# Patient Record
Sex: Female | Born: 1970 | Race: White | Hispanic: No | Marital: Married | Smoking: Never smoker
Health system: Southern US, Community
[De-identification: ages and names within clinical notes are randomized; demographics above are authoritative.]

## PROBLEM LIST (undated history)

## (undated) DIAGNOSIS — E010 Iodine-deficiency related diffuse (endemic) goiter: Secondary | ICD-10-CM

## (undated) DIAGNOSIS — IMO0001 Reserved for inherently not codable concepts without codable children: Secondary | ICD-10-CM

## (undated) HISTORY — PX: TUBAL LIGATION: SHX77

## (undated) HISTORY — DX: Iodine-deficiency related diffuse (endemic) goiter: E01.0

## (undated) HISTORY — DX: Reserved for inherently not codable concepts without codable children: IMO0001

## (undated) HISTORY — PX: OTHER SURGICAL HISTORY: SHX169

---

## 2001-02-22 ENCOUNTER — Encounter: Admission: RE | Admit: 2001-02-22 | Discharge: 2001-05-23 | Payer: Self-pay | Admitting: Family Medicine

## 2003-01-12 ENCOUNTER — Emergency Department (HOSPITAL_COMMUNITY): Admission: EM | Admit: 2003-01-12 | Discharge: 2003-01-12 | Payer: Self-pay | Admitting: Emergency Medicine

## 2004-04-24 ENCOUNTER — Encounter: Admission: RE | Admit: 2004-04-24 | Discharge: 2004-04-24 | Payer: Self-pay | Admitting: Orthopedic Surgery

## 2007-06-11 ENCOUNTER — Ambulatory Visit: Payer: Self-pay | Admitting: Family Medicine

## 2007-06-11 DIAGNOSIS — K3189 Other diseases of stomach and duodenum: Secondary | ICD-10-CM | POA: Insufficient documentation

## 2007-06-11 DIAGNOSIS — R1013 Epigastric pain: Secondary | ICD-10-CM

## 2007-06-11 LAB — CONVERTED CEMR LAB
Bilirubin Urine: NEGATIVE
Blood in Urine, dipstick: NEGATIVE
Glucose, Urine, Semiquant: NEGATIVE
Ketones, urine, test strip: NEGATIVE
Nitrite: NEGATIVE
Protein, U semiquant: NEGATIVE
Specific Gravity, Urine: 1.02
Urobilinogen, UA: NEGATIVE
WBC Urine, dipstick: NEGATIVE
pH: 6

## 2007-06-15 ENCOUNTER — Encounter (INDEPENDENT_AMBULATORY_CARE_PROVIDER_SITE_OTHER): Payer: Self-pay | Admitting: *Deleted

## 2007-06-15 DIAGNOSIS — E079 Disorder of thyroid, unspecified: Secondary | ICD-10-CM | POA: Insufficient documentation

## 2007-06-15 LAB — CONVERTED CEMR LAB
ALT: 28 units/L (ref 0–35)
AST: 18 units/L (ref 0–37)
Albumin: 4.1 g/dL (ref 3.5–5.2)
Alkaline Phosphatase: 60 units/L (ref 39–117)
Basophils Absolute: 0 10*3/uL (ref 0.0–0.1)
Basophils Relative: 0 % (ref 0.0–1.0)
Bilirubin, Direct: 0.1 mg/dL (ref 0.0–0.3)
Cholesterol: 182 mg/dL (ref 0–200)
Eosinophils Absolute: 0.2 10*3/uL (ref 0.0–0.6)
Eosinophils Relative: 2 % (ref 0.0–5.0)
Glucose, Bld: 88 mg/dL (ref 70–99)
HCT: 40.9 % (ref 36.0–46.0)
HDL: 51.8 mg/dL (ref >39.0)
Hemoglobin: 14.3 g/dL (ref 12.0–15.0)
LDL Cholesterol: 118 mg/dL — ABNORMAL HIGH (ref 0–99)
Lymphocytes Relative: 29.4 % (ref 12.0–46.0)
MCHC: 34.8 g/dL (ref 30.0–36.0)
MCV: 88.9 fL (ref 78.0–100.0)
Monocytes Absolute: 0.5 10*3/uL (ref 0.2–0.7)
Monocytes Relative: 6.8 % (ref 3.0–11.0)
Neutro Abs: 4.8 10*3/uL (ref 1.4–7.7)
Neutrophils Relative %: 61.8 % (ref 43.0–77.0)
Platelets: 323 10*3/uL (ref 150–400)
RBC: 4.6 M/uL (ref 3.87–5.11)
RDW: 11.6 % (ref 11.5–14.6)
TSH: 0.26 microintl units/mL — ABNORMAL LOW (ref 0.35–5.50)
Total Bilirubin: 0.8 mg/dL (ref 0.3–1.2)
Total CHOL/HDL Ratio: 3.5
Total Protein: 7.6 g/dL (ref 6.0–8.3)
Triglycerides: 62 mg/dL (ref 0–149)
VLDL: 12 mg/dL (ref 0–40)
WBC: 7.8 10*3/uL (ref 4.5–10.5)

## 2007-07-12 ENCOUNTER — Ambulatory Visit: Payer: Self-pay | Admitting: Family Medicine

## 2007-07-14 ENCOUNTER — Encounter (INDEPENDENT_AMBULATORY_CARE_PROVIDER_SITE_OTHER): Payer: Self-pay | Admitting: *Deleted

## 2007-07-14 LAB — CONVERTED CEMR LAB
Free T4: 1 ng/dL (ref 0.6–1.6)
T3, Free: 3.3 pg/mL (ref 2.3–4.2)
TSH: 0.52 microintl units/mL (ref 0.35–5.50)

## 2008-04-25 LAB — CONVERTED CEMR LAB: Pap Smear: NORMAL

## 2008-05-17 ENCOUNTER — Ambulatory Visit: Payer: Self-pay | Admitting: Internal Medicine

## 2008-05-18 ENCOUNTER — Telehealth (INDEPENDENT_AMBULATORY_CARE_PROVIDER_SITE_OTHER): Payer: Self-pay | Admitting: *Deleted

## 2008-05-18 ENCOUNTER — Encounter (INDEPENDENT_AMBULATORY_CARE_PROVIDER_SITE_OTHER): Payer: Self-pay | Admitting: *Deleted

## 2009-01-15 ENCOUNTER — Ambulatory Visit: Payer: Self-pay | Admitting: Internal Medicine

## 2009-01-26 ENCOUNTER — Ambulatory Visit: Payer: Self-pay | Admitting: Internal Medicine

## 2009-02-01 ENCOUNTER — Encounter (INDEPENDENT_AMBULATORY_CARE_PROVIDER_SITE_OTHER): Payer: Self-pay | Admitting: *Deleted

## 2009-02-01 LAB — CONVERTED CEMR LAB
ALT: 15 units/L (ref 0–35)
AST: 17 units/L (ref 0–37)
BUN: 12 mg/dL (ref 6–23)
Basophils Absolute: 0 10*3/uL (ref 0.0–0.1)
Basophils Relative: 0.3 % (ref 0.0–3.0)
CO2: 26 meq/L (ref 19–32)
Calcium: 9.3 mg/dL (ref 8.4–10.5)
Chloride: 108 meq/L (ref 96–112)
Cholesterol: 157 mg/dL (ref 0–200)
Creatinine, Ser: 0.9 mg/dL (ref 0.4–1.2)
Eosinophils Absolute: 0.1 10*3/uL (ref 0.0–0.7)
Eosinophils Relative: 1.3 % (ref 0.0–5.0)
GFR calc non Af Amer: 73.85 mL/min (ref >60)
Glucose, Bld: 83 mg/dL (ref 70–99)
HCT: 39.4 % (ref 36.0–46.0)
HDL: 50.2 mg/dL (ref >39.00)
Hemoglobin: 13.7 g/dL (ref 12.0–15.0)
LDL Cholesterol: 98 mg/dL (ref 0–99)
Lymphocytes Relative: 28.8 % (ref 12.0–46.0)
Lymphs Abs: 1.9 10*3/uL (ref 0.7–4.0)
MCHC: 34.8 g/dL (ref 30.0–36.0)
MCV: 90.7 fL (ref 78.0–100.0)
Monocytes Absolute: 0.5 10*3/uL (ref 0.1–1.0)
Monocytes Relative: 7.3 % (ref 3.0–12.0)
Neutro Abs: 4.1 10*3/uL (ref 1.4–7.7)
Neutrophils Relative %: 62.3 % (ref 43.0–77.0)
Platelets: 269 10*3/uL (ref 150.0–400.0)
Potassium: 4 meq/L (ref 3.5–5.1)
RBC: 4.35 M/uL (ref 3.87–5.11)
RDW: 11.5 % (ref 11.5–14.6)
Sodium: 139 meq/L (ref 135–145)
TSH: 0.34 microintl units/mL — ABNORMAL LOW (ref 0.35–5.50)
Total CHOL/HDL Ratio: 3
Triglycerides: 45 mg/dL (ref 0.0–149.0)
VLDL: 9 mg/dL (ref 0.0–40.0)
WBC: 6.6 10*3/uL (ref 4.5–10.5)

## 2009-05-14 ENCOUNTER — Ambulatory Visit: Payer: Self-pay | Admitting: Internal Medicine

## 2009-05-21 ENCOUNTER — Telehealth (INDEPENDENT_AMBULATORY_CARE_PROVIDER_SITE_OTHER): Payer: Self-pay | Admitting: *Deleted

## 2009-05-30 LAB — CONVERTED CEMR LAB: TSH: 0.49 microintl units/mL (ref 0.35–5.50)

## 2009-06-28 ENCOUNTER — Ambulatory Visit: Payer: Self-pay | Admitting: Internal Medicine

## 2009-07-02 LAB — CONVERTED CEMR LAB
Free T4: 0.9 ng/dL (ref 0.6–1.6)
T3, Free: 2.8 pg/mL (ref 2.3–4.2)

## 2009-11-13 ENCOUNTER — Ambulatory Visit: Payer: Self-pay | Admitting: Internal Medicine

## 2009-11-13 DIAGNOSIS — R599 Enlarged lymph nodes, unspecified: Secondary | ICD-10-CM | POA: Insufficient documentation

## 2009-12-28 ENCOUNTER — Ambulatory Visit: Payer: Self-pay | Admitting: Internal Medicine

## 2009-12-28 DIAGNOSIS — L538 Other specified erythematous conditions: Secondary | ICD-10-CM

## 2010-01-04 ENCOUNTER — Encounter (INDEPENDENT_AMBULATORY_CARE_PROVIDER_SITE_OTHER): Payer: Self-pay | Admitting: *Deleted

## 2010-01-11 ENCOUNTER — Ambulatory Visit: Payer: Self-pay | Admitting: Internal Medicine

## 2010-01-11 DIAGNOSIS — E049 Nontoxic goiter, unspecified: Secondary | ICD-10-CM

## 2010-01-11 DIAGNOSIS — R21 Rash and other nonspecific skin eruption: Secondary | ICD-10-CM

## 2010-01-11 DIAGNOSIS — M542 Cervicalgia: Secondary | ICD-10-CM

## 2010-01-18 LAB — CONVERTED CEMR LAB
Free T4: 1.49 ng/dL (ref 0.80–1.80)
T3, Free: 2.9 pg/mL (ref 2.3–4.2)
TSH: 0.364 microintl units/mL (ref 0.350–4.500)

## 2010-06-25 NOTE — Assessment & Plan Note (Signed)
Summary: sore throat/neck hurts//kn   Vital Signs:  Patient profile:   40 year old female Weight:      201.38 pounds Temp:     97.9 degrees F oral Pulse rate:   73 / minute Pulse rhythm:   regular BP sitting:   122 / 82  (left arm) Cuff size:   large  Vitals Entered By: Army Fossa CMA (January 11, 2010 2:12 PM) CC: Sore throat, neck hurts Comments Wants spot rechecked on the back of leg. Her neck has been sore for a few weeks  throat is now sore. Pt feels that they are all related??    History of Present Illness: complained of posterior neck pain for few days, is more painful when she moves her neck  than when she self palpates it  Lymphadenopathies on the right neck ? Also would like a Lyme disease test. Wonders about the rash in her legs that we see her for few days ago and the fact that now she has neck pain and a question of lymphadenopathy.  ROS Denies fevers No sore throat All along the leg there is no rash Denies exposures to ticks  Current Medications (verified): 1)  None  Allergies: 1)  ! Penicillin  Past History:  Past Medical History: Reviewed history from 01/15/2009 and no changes required. G3 P3  Past Surgical History: Reviewed history from 06/11/2007 and no changes required. Assure procedure: Contraception  Social History: Reviewed history from 01/15/2009 and no changes required. 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   Neck:  full range of motion, slightly tender at the posterior aspect of the neck. No lymphadenopathies. the power plant is a slightly asymmetric. Minimal enlargement on the right without tenderness or nodules Lungs:  normal respiratory effort, no intercostal retractions, no accessory muscle use, and normal breath sounds.   Skin:   annular skin  lesion previously described seems to be getting better   Impression & Recommendations:  Problem # 1:  NECK PAIN (ICD-723.1) presents with neck pain and question of LAD no evidence of lymphadenopathy today Neck pain seems mechanical. Recommend rest  and observation for now  Problem # 2:  SKIN RASH (ICD-782.1) she has a skin rash in the leg, doubt this is Lyme disease but the patient really likes this checked. Will order labs, if positive, we may need to refer her to ID Orders: T-Lyme Disease ([id number]) Specimen Handling ([id number])  Problem # 3:  THYROID STIMULATING HORMONE, ABNORMAL (ICD-246.9) very mild and on and off decrease in her TSH. 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 She has not noticed any enlargement in the last few years Plan: Observation for now  Patient Instructions: 1)  Please schedule a follow-up appointment in 4 months, physical exam

## 2010-06-25 NOTE — Assessment & Plan Note (Signed)
Summary: EAR ACHE//KN   Vital Signs:  Patient profile:   40 year old female Height:      67.5 inches Weight:      197 pounds Temp:     98.6 degrees F oral Pulse rate:   72 / minute BP sitting:   110 / 78  (left arm)  Vitals Entered By: Jeremy Johann CMA (November 13, 2009 11:13 AM) CC: SHARP STABBING PAIN IN RIGHT EAR X3DAY Comments --TENDERNESS TO TOUCH REVIEWED MED LIST, PATIENT AGREED   History of Present Illness: one-week history of sharp pain at the right TMJ area. the pain is not worse with chewing She also has noticed some swelling there.  review of systems Denies fever, runny nose or sore throat No recent dental pain or dental work Note year discharge  Allergies: 1)  ! Penicillin  Past History:  Past Medical History: Reviewed history from 01/15/2009 and no changes required. G3 P3  Past Surgical History: Reviewed history from 06/11/2007 and no changes required. Assure procedure: Contraception  Social History: Reviewed history from 01/15/2009 and no changes required. 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   Head:  face symmetric No clicks at the TMJs Ears:  R ear normal and L ear normal.   She does have a pre-auricular LADs right and left. The right-sided one about 3/4 cm (larger than the left), mobile, nontender Mouth:  good dentition and no gingival abnormalities.  no evidence of an abscess to palpation Neck:  no thyromegaly.     Impression & Recommendations:  Problem # 1:  LYMPHADENOPATHY (ICD-785.6) pre-auricular lymphadenopathy No evidence of otitis media or externa Plan: Observation, if not better in a few days she will call for antibiotics. If the LAD resolves, no further eval

## 2010-06-25 NOTE — Letter (Signed)
Summary: Primary Care Consult Scheduled Letter  [facility] [place]  [address] [place], [place] [address]   Phone: [phone]  Fax: [phone]      01/04/2010 MRN: [id number]  Jenna Gardner [address] [place], [place]  [address]    Dear [name],    We have scheduled an appointment for you.  At the recommendation of Dr. Willow Ora, we have scheduled you a consult with Dr. Rolena Infante of [facility] on 01-14-2010 at 11:20am.  Their address is [address] [place] [address]. The office phone number is [phone].  If this appointment day and time is not convenient for you, please feel free to call the office of the doctor you are being referred to at the number listed above and reschedule the appointment.    It is important for you to keep your scheduled appointments. We are here to make sure you are given good patient care.   Thank you,    Renee, Patient Care Coordinator  at [facility]

## 2010-06-25 NOTE — Assessment & Plan Note (Signed)
Summary: RED KNOT ON BACK OF RIGHT LEG/KN   Vital Signs:  Patient profile:   40 year old female Height:      67.5 inches Weight:      202.25 pounds BMI:     31.32 Pulse rate:   79 / minute Pulse rhythm:   regular BP sitting:   130 / 80  (left arm) Cuff size:   large  Vitals Entered By: Army Fossa CMA (December 28, 2009 3:50 PM) CC: Has red bump on back of lower right leg Comments - x 1 month - not getting any bigger.    History of Present Illness: skin lesion in the back of the right leg for 3 or 4 weeks Has not increased in size or changed much He used a over-the-counter cream for a few days (name?) but it didn't help  ROS No fever No itching No discharge   Current Medications (verified): 1)  None  Allergies: 1)  ! Penicillin  Past History:  Past Medical History: Reviewed history from 01/15/2009 and no changes required. G3 P3  Past Surgical History: Reviewed history from 06/11/2007 and no changes required. Assure procedure: Contraception  Social History: Reviewed history from 01/15/2009 and no changes required. 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   Skin:  at the right calf she has a annular skin lesion, center is clear The lesion is slightly elevated, slightly red, no warm or tender. Seems to be involving the s.q.  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 Will refer to dermatology for confirmation  Orders: Dermatology Referral [facility])

## 2010-11-08 ENCOUNTER — Ambulatory Visit (INDEPENDENT_AMBULATORY_CARE_PROVIDER_SITE_OTHER): Payer: Managed Care, Other (non HMO) | Admitting: Internal Medicine

## 2010-11-08 ENCOUNTER — Encounter: Payer: Self-pay | Admitting: Internal Medicine

## 2010-11-08 DIAGNOSIS — J069 Acute upper respiratory infection, unspecified: Secondary | ICD-10-CM | POA: Insufficient documentation

## 2010-11-08 MED ORDER — AZITHROMYCIN 250 MG PO TABS: ORAL_TABLET | ORAL | Status: AC

## 2010-11-08 NOTE — Progress Notes (Signed)
  Subjective:    Patient ID: Jenna Gardner, female    DOB: 11-27-1970, 40 y.o.   MRN: [id number]  HPI Symptoms started a week ago, much worse for the last 2 days. Sore throat, sinus and chest congestion.  No past medical history on file.  Past Surgical History  Procedure Date  . Assure     for contraception    Review of Systems Having a subjective low-grade temp on and off No nausea, vomiting, myalgias. She does feel tired. She has seen some green nasal discharge and sputum production with cough      Objective:   Physical Exam  Constitutional: She appears well-developed and well-nourished. No distress.  HENT:  Head: Normocephalic and atraumatic.  Right Ear: External ear normal.  Left Ear: External ear normal.  Mouth/Throat: No oropharyngeal exudate.       Nontender to palpation @  the face  Cardiovascular: Normal rate, regular rhythm and normal heart sounds.   No murmur heard. Pulmonary/Chest: Effort normal and breath sounds normal. No respiratory distress. She has no rales.       Few rhonchi with cough          Assessment & Plan:

## 2010-11-08 NOTE — Patient Instructions (Addendum)
Rest, fluids , tylenol For cough, take Mucinex DM twice a day as needed  Take the antibiotic as prescribed (zpack ) only if not better in the next 3 or 4 days  Call if no better in few days Call anytime if the symptoms are severe, you have high fever, short of breath

## 2010-11-08 NOTE — Assessment & Plan Note (Signed)
See instructions

## 2010-12-05 ENCOUNTER — Encounter: Payer: Managed Care, Other (non HMO) | Admitting: Internal Medicine

## 2011-02-04 ENCOUNTER — Encounter (HOSPITAL_BASED_OUTPATIENT_CLINIC_OR_DEPARTMENT_OTHER): Payer: Self-pay | Admitting: Emergency Medicine

## 2011-02-04 ENCOUNTER — Emergency Department (HOSPITAL_BASED_OUTPATIENT_CLINIC_OR_DEPARTMENT_OTHER)
Admission: EM | Admit: 2011-02-04 | Discharge: 2011-02-04 | Disposition: A | Discharge status: Home / Self Care | Payer: No Typology Code available for payment source | Attending: Emergency Medicine | Admitting: Emergency Medicine

## 2011-02-04 ENCOUNTER — Emergency Department (INDEPENDENT_AMBULATORY_CARE_PROVIDER_SITE_OTHER): Payer: No Typology Code available for payment source

## 2011-02-04 DIAGNOSIS — M5090 Cervical disc disorder, unspecified, unspecified cervical region: Secondary | ICD-10-CM

## 2011-02-04 DIAGNOSIS — M545 Low back pain, unspecified: Secondary | ICD-10-CM | POA: Insufficient documentation

## 2011-02-04 DIAGNOSIS — M5137 Other intervertebral disc degeneration, lumbosacral region: Secondary | ICD-10-CM

## 2011-02-04 DIAGNOSIS — Y92410 Unspecified street and highway as the place of occurrence of the external cause: Secondary | ICD-10-CM | POA: Insufficient documentation

## 2011-02-04 DIAGNOSIS — M542 Cervicalgia: Secondary | ICD-10-CM | POA: Insufficient documentation

## 2011-02-04 DIAGNOSIS — T148XXA Other injury of unspecified body region, initial encounter: Secondary | ICD-10-CM

## 2011-02-04 MED ORDER — ACETAMINOPHEN 325 MG PO TABS
650.0000 mg | ORAL_TABLET | Freq: Once | ORAL | Status: AC
  Administered 2011-02-04: 650 mg via ORAL
  Filled 2011-02-04: qty 2

## 2011-02-04 MED ORDER — HYDROCODONE-ACETAMINOPHEN 5-500 MG PO TABS: 1.0000 | ORAL_TABLET | Freq: Four times a day (QID) | ORAL | Status: AC | PRN

## 2011-02-04 MED ORDER — CYCLOBENZAPRINE HCL 10 MG PO TABS: 10.0000 mg | ORAL_TABLET | Freq: Two times a day (BID) | ORAL | Status: AC | PRN

## 2011-02-04 MED ORDER — IBUPROFEN 600 MG PO TABS: 600.0000 mg | ORAL_TABLET | Freq: Four times a day (QID) | ORAL | Status: AC | PRN
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
## 2011-02-04 NOTE — ED Notes (Signed)
Pt ambulatory to restroom without difficulty.

## 2011-02-04 NOTE — ED Notes (Signed)
[place] [employer] at bs
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
## 2011-04-01 ENCOUNTER — Encounter: Payer: Self-pay | Admitting: Internal Medicine

## 2011-04-02 ENCOUNTER — Encounter: Payer: Self-pay | Admitting: Internal Medicine

## 2011-04-02 ENCOUNTER — Ambulatory Visit (INDEPENDENT_AMBULATORY_CARE_PROVIDER_SITE_OTHER): Payer: No Typology Code available for payment source | Admitting: Internal Medicine

## 2011-04-02 DIAGNOSIS — E049 Nontoxic goiter, unspecified: Secondary | ICD-10-CM

## 2011-04-02 DIAGNOSIS — Z0000 Encounter for general adult medical examination without abnormal findings: Secondary | ICD-10-CM
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
## 2011-04-02 NOTE — Assessment & Plan Note (Signed)
Pt had a slightly asymmetric thyroid on physical exam in 2010, normal exam today ~ 2002 had a "scan" done for thyromegaly  and was negative. H/o on-off TSH decrease  Plan: Observation for now

## 2011-04-02 NOTE — Assessment & Plan Note (Addendum)
Td 09 Declined flu shot Labs Diet-exercise discussed FH CAD -------> EKG today NSR, no old EKGs

## 2011-04-02 NOTE — Patient Instructions (Signed)
Came back fasting: FLP CMP TSH CBC ----dx V70

## 2011-04-03 ENCOUNTER — Other Ambulatory Visit: Payer: Self-pay | Admitting: Internal Medicine

## 2011-04-03 DIAGNOSIS — Z0000 Encounter for general adult medical examination without abnormal findings: Secondary | ICD-10-CM

## 2011-04-04 ENCOUNTER — Other Ambulatory Visit (INDEPENDENT_AMBULATORY_CARE_PROVIDER_SITE_OTHER): Payer: No Typology Code available for payment source

## 2011-04-04 DIAGNOSIS — Z0000 Encounter for general adult medical examination without abnormal findings: Secondary | ICD-10-CM

## 2011-04-04 LAB — TSH: TSH: 0.5 u[IU]/mL (ref 0.35–5.50)

## 2011-04-04 LAB — CBC WITH DIFFERENTIAL/PLATELET
Basophils Absolute: 0.1 10*3/uL (ref 0.0–0.1)
Basophils Relative: 0.7 % (ref 0.0–3.0)
Eosinophils Absolute: 0.2 10*3/uL (ref 0.0–0.7)
Eosinophils Relative: 2.1 % (ref 0.0–5.0)
HCT: 37.8 % (ref 36.0–46.0)
Hemoglobin: 12.8 g/dL (ref 12.0–15.0)
Lymphocytes Relative: 24.1 % (ref 12.0–46.0)
Lymphs Abs: 2.1 10*3/uL (ref 0.7–4.0)
MCHC: 33.8 g/dL (ref 30.0–36.0)
MCV: 91.2 fl (ref 78.0–100.0)
Monocytes Absolute: 0.5 10*3/uL (ref 0.1–1.0)
Monocytes Relative: 5.7 % (ref 3.0–12.0)
Neutro Abs: 5.9 10*3/uL (ref 1.4–7.7)
Neutrophils Relative %: 67.4 % (ref 43.0–77.0)
Platelets: 315 10*3/uL (ref 150.0–400.0)
RBC: 4.15 Mil/uL (ref 3.87–5.11)
RDW: 12.6 % (ref 11.5–14.6)
WBC: 8.8 10*3/uL (ref 4.5–10.5)

## 2011-04-04 LAB — COMPREHENSIVE METABOLIC PANEL
ALT: 15 U/L (ref 0–35)
AST: 15 U/L (ref 0–37)
Albumin: 3.6 g/dL (ref 3.5–5.2)
Alkaline Phosphatase: 60 U/L (ref 39–117)
BUN: 9 mg/dL (ref 6–23)
CO2: 27 mEq/L (ref 19–32)
Calcium: 8.9 mg/dL (ref 8.4–10.5)
Chloride: 105 mEq/L (ref 96–112)
Creatinine, Ser: 0.7 mg/dL (ref 0.4–1.2)
GFR: 99.27 mL/min (ref >60.00)
Glucose, Bld: 83 mg/dL (ref 70–99)
Potassium: 4.3 mEq/L (ref 3.5–5.1)
Sodium: 139 mEq/L (ref 135–145)
Total Bilirubin: 0.7 mg/dL (ref 0.3–1.2)
Total Protein: 6.5 g/dL (ref 6.0–8.3)

## 2011-04-04 LAB — LIPID PANEL
Cholesterol: 173 mg/dL (ref 0–200)
HDL: 61.1 mg/dL (ref >39.00)
LDL Cholesterol: 91 mg/dL (ref 0–99)
Total CHOL/HDL Ratio: 3
Triglycerides: 106 mg/dL (ref 0.0–149.0)
VLDL: 21.2 mg/dL (ref 0.0–40.0)

## 2011-04-04 NOTE — Progress Notes (Signed)
12  

## 2011-04-09 ENCOUNTER — Telehealth: Payer: Self-pay

## 2011-04-09 NOTE — Telephone Encounter (Signed)
Message copied by Beverely Low on Wed Apr 09, 2011  5:03 PM ------      Message from: Willow Ora E      Created: Tue Apr 08, 2011  1:01 PM       Advise patient:      Cholesterol is very good, thyroid is normal. All other labs normal, very good results !

## 2011-04-09 NOTE — Telephone Encounter (Signed)
Left message to advise pt labs are normal and copy mailed to pt

## 2011-06-11 DIAGNOSIS — Z0279 Encounter for issue of other medical certificate: Secondary | ICD-10-CM

## 2011-07-11 ENCOUNTER — Ambulatory Visit (INDEPENDENT_AMBULATORY_CARE_PROVIDER_SITE_OTHER): Payer: Managed Care, Other (non HMO) | Admitting: Internal Medicine

## 2011-07-11 VITALS — BP 118/82 | HR 68 | Temp 98.3°F | Wt 211.0 lb

## 2011-07-11 DIAGNOSIS — R21 Rash and other nonspecific skin eruption: Secondary | ICD-10-CM

## 2011-07-11 MED ORDER — HYDROCORTISONE 2.5 % EX CREA: TOPICAL_CREAM | Freq: Two times a day (BID) | CUTANEOUS | Status: DC

## 2011-07-11 MED ORDER — SULFAMETHOXAZOLE-TRIMETHOPRIM 800-160 MG PO TABS: 1.0000 | ORAL_TABLET | Freq: Two times a day (BID) | ORAL | Status: DC

## 2011-07-11 NOTE — Progress Notes (Signed)
  Subjective:    Patient ID: Jenna Gardner, female    DOB: Jul 28, 1970, 41 y.o.   MRN: [id number]  HPI Acute visit About a week ago she was working in her yard and scratch her left arm with a thorn. The next day, the area got red. In the last couple of days it has gotten indurated. + itching, no helped by otc creams   Past Medical History  Diagnosis Date  . No active medical problems      Review of Systems No fever chills No other skin lesions Feels well in general    Objective:   Physical Exam  Constitutional: She appears well-developed. No distress.  HENT:  Head: Normocephalic and atraumatic.  Skin: She is not diaphoretic.       L forearm fas a 12x3 mm scratch. Mild redness and induration around it, no fluctuance, d/c, golden crust       Assessment & Plan:  Early cellulitis: Cellulitis d/t a scratch w./ a thorn Plan: bactrim to cover MRSA and more uncomon pathogens , hydrocortisone to help w/itching (which may be contact dermatitis)

## 2011-07-11 NOTE — Patient Instructions (Signed)
Bactrim x 5 days, if not completely well , take x 1 week Cream x 1 week

## 2011-07-13 ENCOUNTER — Encounter: Payer: Self-pay | Admitting: Internal Medicine

## 2011-07-13 NOTE — Assessment & Plan Note (Signed)
See above

## 2011-07-15 ENCOUNTER — Encounter: Payer: Self-pay | Admitting: Internal Medicine

## 2011-07-15 ENCOUNTER — Ambulatory Visit (INDEPENDENT_AMBULATORY_CARE_PROVIDER_SITE_OTHER): Payer: Managed Care, Other (non HMO) | Admitting: Internal Medicine

## 2011-07-15 VITALS — BP 114/80 | HR 78 | Temp 98.3°F | Wt 211.0 lb

## 2011-07-15 DIAGNOSIS — R21 Rash and other nonspecific skin eruption: Secondary | ICD-10-CM

## 2011-07-15 MED ORDER — DOXYCYCLINE HYCLATE 100 MG PO TABS: 100.0000 mg | ORAL_TABLET | Freq: Two times a day (BID) | ORAL | Status: AC

## 2011-07-15 MED ORDER — PREDNISONE 10 MG PO TABS: ORAL_TABLET | ORAL | Status: DC

## 2011-07-15 NOTE — Assessment & Plan Note (Signed)
Developed what I thought was cellulitis of the left arm after she scratched her arm with a thorn working in the yard. She was started on Bactrim. Since then, what seemed to be cellulitis is about the same but has developed other areas of swelling and redness,they seem be an allergic reaction. It is unclear if the allergic reaction is to whatever she was exposed at the yard or to Bactrim (I would expect a more widespread reaction if she has an abx allergy).  I will put Bactrim in her allergy list although I am not completely sure she is allergic to it. We'll switch her to doxycycline and prednisone. If not better, will refer to dermatology or ID.

## 2011-07-15 NOTE — Patient Instructions (Signed)
Stop bactrim Start doxy and prednisone Call if no better in 2 -3 day , call anytime if worse

## 2011-07-15 NOTE — Progress Notes (Signed)
  Subjective:    Patient ID: Jenna Gardner, female    DOB: 01-Jul-1970, 41 y.o.   MRN: [id number]  HPI Acute visit She was seen last week with a rash, prescribed Bactrim and steroid cream. The next day, she developed other areas of redness and warmness around the lesion on the left arm but also on the right arm.  PMH Birth control-- Essure  Review of Systems Denies fever or chills No joint aches Admits to mild headache.     Objective:   Physical Exam  Constitutional: She appears well-developed and well-nourished.  HENT:       Lips normal  Musculoskeletal:       Arms:         Assessment & Plan:

## 2012-05-05 ENCOUNTER — Telehealth: Payer: Self-pay | Admitting: Family

## 2012-05-05 ENCOUNTER — Ambulatory Visit (INDEPENDENT_AMBULATORY_CARE_PROVIDER_SITE_OTHER): Payer: Managed Care, Other (non HMO) | Admitting: Family

## 2012-05-05 ENCOUNTER — Ambulatory Visit (HOSPITAL_BASED_OUTPATIENT_CLINIC_OR_DEPARTMENT_OTHER): Admission: RE | Admit: 2012-05-05 | Payer: Managed Care, Other (non HMO) | Source: Ambulatory Visit

## 2012-05-05 ENCOUNTER — Ambulatory Visit (HOSPITAL_BASED_OUTPATIENT_CLINIC_OR_DEPARTMENT_OTHER)
Admission: RE | Admit: 2012-05-05 | Discharge: 2012-05-05 | Disposition: A | Discharge status: Home / Self Care | Payer: Managed Care, Other (non HMO) | Source: Ambulatory Visit | Attending: Family | Admitting: Family

## 2012-05-05 VITALS — BP 130/76 | HR 88 | Wt 222.0 lb

## 2012-05-05 DIAGNOSIS — R109 Unspecified abdominal pain: Secondary | ICD-10-CM

## 2012-05-05 DIAGNOSIS — R1011 Right upper quadrant pain: Secondary | ICD-10-CM

## 2012-05-05 LAB — CBC WITH DIFFERENTIAL/PLATELET
Basophils Absolute: 0.1 10*3/uL (ref 0.0–0.1)
Basophils Relative: 1 % (ref 0–1)
Eosinophils Absolute: 0.2 10*3/uL (ref 0.0–0.7)
Eosinophils Relative: 3 % (ref 0–5)
HCT: 39.4 % (ref 36.0–46.0)
Hemoglobin: 13.3 g/dL (ref 12.0–15.0)
Lymphocytes Relative: 31 % (ref 12–46)
Lymphs Abs: 2.6 10*3/uL (ref 0.7–4.0)
MCH: 29.8 pg (ref 26.0–34.0)
MCHC: 33.8 g/dL (ref 30.0–36.0)
MCV: 88.1 fL (ref 78.0–100.0)
Monocytes Absolute: 0.7 10*3/uL (ref 0.1–1.0)
Monocytes Relative: 8 % (ref 3–12)
Neutro Abs: 4.8 10*3/uL (ref 1.7–7.7)
Neutrophils Relative %: 57 % (ref 43–77)
Platelets: 366 10*3/uL (ref 150–400)
RBC: 4.47 MIL/uL (ref 3.87–5.11)
RDW: 13.1 % (ref 11.5–15.5)
WBC: 8.4 10*3/uL (ref 4.0–10.5)

## 2012-05-05 NOTE — Patient Instructions (Addendum)
Please complete your blood work prior to leaving.  Complete your abdominal ultrasound on the first floor this evening at 5PM.

## 2012-05-05 NOTE — Progress Notes (Signed)
Subjective:    Patient ID: Jenna Gardner, female    DOB: [birth year], 41 y.o.   MRN: [id number]  HPI  Jenna Gardner is a 41 yr old female who presents today with chief compaint of abdominal pain. She reports that the abdominal pain is located on the right side up under her ribs.  Pain started last night at 6 PM and is constant.  Pain is worse with  movement or deep breath.  She reports some mild nausea which started last night as well.  She denies vomitting. She has been tolerating PO's. Denies associated fever.  Rports BM's have been normal.   Review of Systems See HPI  Past Medical History  Diagnosis Date  . No active medical problems     History   Social History  . Marital Status: Married    Spouse Name: N/A    Number of Children: 3  . 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 Smoking status: Never Smoker   . Smokeless tobacco: Never Used  . Alcohol Use: Yes     Comment: OCCASIONALLY  . Drug Use: No  . Sexually Active: Not on file   Other Topics Concern  . Not on file   Social History Narrative   Diet: slt healthier--- exercise: needs improvement --- has 3 kids (teenagers )    Past Surgical History  Procedure Date  . Assure     for contraception  . Tubal ligation   . G3 p3     ??    Family History  Problem Relation Age of Onset  . Heart disease Mother 27    M MI 31, F MI x 2 initial MI at age 54  ,also GM  . Cancer Mother     UTERINE  . Diabetes Neg Hx   . Hypertension Neg Hx   . Stroke Neg Hx   . Breast cancer Other     Father side  . Colon cancer Neg Hx     Allergies  Allergen Reactions  . Penicillins   . Bactrim Rash    Question af allergic reaction, see OV 07-15-2011     Current Outpatient Prescriptions on File Prior to Visit  Medication Sig Dispense Refill  . hydrocortisone 2.5 % cream Apply topically 2 (two) times daily.  30 g  0  . predniSONE (DELTASONE) 10 MG  tablet 3 tabs x 3 days, 2 tabs x 3 days, 1 tab x 3 days  18 tablet  0    BP 130/76  Pulse 88  Wt 222 lb (100.699 kg)  SpO2 95%       Objective:   Physical Exam  Constitutional: She is oriented to person, place, and time. She appears well-developed and well-nourished. No distress.  Cardiovascular: Normal rate and regular rhythm.   No murmur heard. Pulmonary/Chest: Effort normal and breath sounds normal. No respiratory distress. She has no wheezes. She has no rales. She exhibits no tenderness.  Abdominal: Soft. Bowel sounds are normal. She exhibits no distension and no mass. There is no rebound and no guarding.       Mild tenderness to palpation RUQ without guarding.  Neurological: She is alert and oriented to person, place, and time.  Skin: Skin is warm and dry.  Psychiatric: She has a normal mood and affect. Her behavior is normal. Judgment and thought content normal.  Assessment & Plan:

## 2012-05-05 NOTE — Assessment & Plan Note (Signed)
41 yr old female with <24 hr hx of RUQ pain.  Rule out cholecystitis.  Will obtain LFT's and abdominal US.

## 2012-05-05 NOTE — Telephone Encounter (Addendum)
pls call pt and let her know that her US shows normal gall bladder.  Lab work is also normal. I will refer her to GI.

## 2012-05-06 LAB — HEPATIC FUNCTION PANEL
ALT: 16 U/L (ref 0–35)
AST: 17 U/L (ref 0–37)
Albumin: 4.5 g/dL (ref 3.5–5.2)
Alkaline Phosphatase: 68 U/L (ref 39–117)
Bilirubin, Direct: 0.1 mg/dL (ref 0.0–0.3)
Indirect Bilirubin: 0.2 mg/dL (ref 0.0–0.9)
Total Bilirubin: 0.3 mg/dL (ref 0.3–1.2)
Total Protein: 6.8 g/dL (ref 6.0–8.3)

## 2012-05-06 LAB — LIPASE: Lipase: 24 U/L (ref 0–75)

## 2012-05-07 ENCOUNTER — Encounter: Payer: Self-pay | Admitting: Gastroenterology

## 2012-05-07 NOTE — Telephone Encounter (Signed)
Notified pt and she states [facility] left her a message to call and schedule an appointment. She will get back in touch with them for appt.

## 2012-06-02 ENCOUNTER — Encounter: Payer: Self-pay | Admitting: Gastroenterology

## 2012-06-02 ENCOUNTER — Ambulatory Visit (INDEPENDENT_AMBULATORY_CARE_PROVIDER_SITE_OTHER): Payer: Managed Care, Other (non HMO) | Admitting: Gastroenterology

## 2012-06-02 VITALS — BP 110/66 | HR 84 | Ht 67.0 in | Wt 221.8 lb

## 2012-06-02 DIAGNOSIS — R1011 Right upper quadrant pain: Secondary | ICD-10-CM

## 2012-06-02 DIAGNOSIS — K8020 Calculus of gallbladder without cholecystitis without obstruction: Secondary | ICD-10-CM

## 2012-06-02 DIAGNOSIS — K3189 Other diseases of stomach and duodenum: Secondary | ICD-10-CM

## 2012-06-02 DIAGNOSIS — R109 Unspecified abdominal pain: Secondary | ICD-10-CM

## 2012-06-02 DIAGNOSIS — R1013 Epigastric pain: Secondary | ICD-10-CM

## 2012-06-02 NOTE — Assessment & Plan Note (Signed)
Patient has nonspecific dyspepsia. Symptoms could be related2 esophageal reflux. Chronic cholecystitis must also be considered, especially in view of a history of severe right upper quadrant pain.  Recommendations #1 trial of Nexium 40 mg daily; discontinue Pepcid #2 HIDA scan #3 to consider upper endoscopy if symptoms persist despite change in medications and HIDA scan is negative

## 2012-06-02 NOTE — Patient Instructions (Addendum)
You have been scheduled for a HIDA scan at [facility] (1st floor) on . Please arrive 15 minutes prior to your scheduled appointment on 06/10/2012 at 12:30pm . Make certain not to have anything to eat or drink at least 6 hours prior to your test. Should this appointment date or time not work well for you, please call radiology scheduling at [phone].  _____________________________________________________________________ hepatobiliary (HIDA) scan is an imaging procedure used to diagnose problems in the liver, gallbladder and bile ducts. In the HIDA scan, a radioactive chemical or tracer is injected into a vein in your arm. The tracer is handled by the liver like bile. Bile is a fluid produced and excreted by your liver that helps your digestive system break down fats in the foods you eat. Bile is stored in your gallbladder and the gallbladder releases the bile when you eat a meal. A special nuclear medicine scanner (gamma camera) tracks the flow of the tracer from your liver into your gallbladder and small intestine.  During your HIDA scan  You'll be asked to change into a hospital gown before your HIDA scan begins. Your health care team will position you on a table, usually on your back. The radioactive tracer is then injected into a vein in your arm.The tracer travels through your bloodstream to your liver, where it's taken up by the bile-producing cells. The radioactive tracer travels with the bile from your liver into your gallbladder and through your bile ducts to your small intestine.You may feel some pressure while the radioactive tracer is injected into your vein. As you lie on the table, a special gamma camera is positioned over your abdomen taking pictures of the tracer as it moves through your body. The gamma camera takes pictures continually for about an hour. You'll need to keep still during the HIDA scan. This can become uncomfortable, but you may find that you can lessen the discomfort by taking deep  breaths and thinking about other things. Tell your health care team if you're uncomfortable. The radiologist will watch on a computer the progress of the radioactive tracer through your body. The HIDA scan may be stopped when the radioactive tracer is seen in the gallbladder and enters your small intestine. This typically takes about an hour. In some cases extra imaging will be performed if original images aren't satisfactory, if morphine is given to help visualize the gallbladder or if the medication CCK is given to look at the contraction of the gallbladder. This test typically takes 2 hours to complete.  Discontinue Pepcid Start Nexium Call back in one week and let Dr Arlyce Dice know how you are doing ________________________________________________________________________

## 2012-06-02 NOTE — Assessment & Plan Note (Signed)
Ultrasound and LFTs are normal. Acalculus cholecystitis remains a possibility.  Recommendations #1 HIDA scan

## 2012-06-02 NOTE — Progress Notes (Signed)
History of Present Illness: Pleasant 42 year old white female referred at the request of Dr. Drue Novel for evaluation of abdominal discomfort.  Approximately one month ago she developed severe right upper quadrant pain lasting about 2 days. It was not accompanied by nausea or vomiting. Pain subsided spontaneously. Abdominal ultrasound and LFTs were normal. She's not had a recurrence. She is complaining of persistent intermittent mild upper abdominal discomfort sometimes associated with excess gas and bloating. She may have bloating even in the absence of eating. She's taken Pepcid without relief. This has been a persistent problem for several months. She denies change of bowel habits, nausea but has occasional pyrosis.    Past Medical History  Diagnosis Date  . No active medical problems    Past Surgical History  Procedure Date  . Assure     for contraception  . Tubal ligation   . G3 p3     ??   family history includes Breast cancer in her other and paternal aunt; Diabetes in her father; Heart disease (age of onset:76) in her mother; and Uterine cancer in her mother.  There is no history of Hypertension, and Stroke, and Colon cancer, . Current Outpatient Prescriptions  Medication Sig Dispense Refill  . famotidine (PEPCID) 10 MG tablet Take 10 mg by mouth 2 (two) times daily as needed.       Allergies as of 06/02/2012 - Review Complete 06/02/2012  Allergen Reaction Noted  . Penicillins  06/11/2007  . Bactrim Rash 07/15/2011    reports that she has never smoked. She has never used smokeless tobacco. She reports that she drinks alcohol. She reports that she does not use illicit drugs.     Review of Systems: Pertinent positive and negative review of systems were noted in the above HPI section. All other review of systems were otherwise negative.  Vital signs were reviewed in today's medical record Physical Exam: General: Well developed , well nourished, no acute distress Head: Normocephalic  and atraumatic Eyes:  sclerae anicteric, EOMI Ears: Normal auditory acuity Mouth: No deformity or lesions Neck: Supple, no masses or thyromegaly Lungs: Clear throughout to auscultation Heart: Regular rate and rhythm; no murmurs, rubs or bruits Abdomen: Soft, non tender and non distended. No masses, hepatosplenomegaly or hernias noted. Normal Bowel sounds. There is no succussion splash Rectal:deferred Musculoskeletal: Symmetrical with no gross deformities  Skin: No lesions on visible extremities Pulses:  Normal pulses noted Extremities: No clubbing, cyanosis, edema or deformities noted Neurological: Alert oriented x 4, grossly nonfocal Cervical Nodes:  No significant cervical adenopathy Inguinal Nodes: No significant inguinal adenopathy Psychological:  Alert and cooperative. Normal mood and affect

## 2012-06-09 ENCOUNTER — Ambulatory Visit (INDEPENDENT_AMBULATORY_CARE_PROVIDER_SITE_OTHER): Payer: Managed Care, Other (non HMO) | Admitting: Internal Medicine

## 2012-06-09 ENCOUNTER — Encounter: Payer: Self-pay | Admitting: Internal Medicine

## 2012-06-09 VITALS — BP 112/72 | HR 73 | Temp 97.9°F | Ht 68.0 in | Wt 217.0 lb

## 2012-06-09 DIAGNOSIS — E049 Nontoxic goiter, unspecified: Secondary | ICD-10-CM

## 2012-06-09 DIAGNOSIS — Z0000 Encounter for general adult medical examination without abnormal findings: Secondary | ICD-10-CM

## 2012-06-09 DIAGNOSIS — F419 Anxiety disorder, unspecified: Secondary | ICD-10-CM | POA: Insufficient documentation

## 2012-06-09 DIAGNOSIS — R1011 Right upper quadrant pain: Secondary | ICD-10-CM

## 2012-06-09 LAB — LDL CHOLESTEROL, DIRECT: Direct LDL: 122.4 mg/dL

## 2012-06-09 LAB — BASIC METABOLIC PANEL
CO2: 27 mEq/L (ref 19–32)
Calcium: 9.6 mg/dL (ref 8.4–10.5)
Glucose, Bld: 93 mg/dL (ref 70–99)
Sodium: 137 mEq/L (ref 135–145)

## 2012-06-09 LAB — TSH: TSH: 0.65 u[IU]/mL (ref 0.35–5.50)

## 2012-06-09 LAB — LIPID PANEL: HDL: 59.2 mg/dL (ref >39.00)

## 2012-06-09 MED ORDER — ALPRAZOLAM 0.25 MG PO TABS: 0.2500 mg | ORAL_TABLET | Freq: Two times a day (BID) | ORAL | Status: DC | PRN

## 2012-06-09 NOTE — Assessment & Plan Note (Signed)
Minimal if any thyroid enlargement on physical exam, no asymmetry or nodule. Plan: Observation for now

## 2012-06-09 NOTE — Assessment & Plan Note (Signed)
Td 09 Female care-- dr Christella Hartigan [facility]) FH breast cancer per gyn, insurance has denied [employer] discussed FH CAD, asx

## 2012-06-09 NOTE — Assessment & Plan Note (Addendum)
Chart reviewed, history of dyspepsia, saw GI, ultrasound and LFTs negative. Acalculus cholecystitis? Was prescribed a HIDA scan, patient cancelled because she is now asymptomatic.  recommend to call immediately if symptoms resurface.

## 2012-06-09 NOTE — Assessment & Plan Note (Signed)
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 Plan: Xanax when necessary as long as she's not getting too sleepy and she is not driving.

## 2012-06-09 NOTE — Progress Notes (Signed)
  Subjective:    Patient ID: Jenna Gardner, female    DOB: 06/27/1970, 42 y.o.   MRN: [id number]  HPI Complete physical exam  Past Medical History  Diagnosis Date  . Contraception     essure  . Thyromegaly    Past Surgical History  Procedure Date  . Tubal ligation     essure   . G3 p3    History   Social History  . Marital Status: Married    Spouse Name: Alecia Lemming    Number of Children: 3  . Years of Education: N/A   Occupational History  . [employer]  of [place]  [employer]   Social History Main Topics  . Smoking status: Never Smoker   . Smokeless tobacco: Never Used  . Alcohol Use: Yes     Comment: OCCASIONALLY  . Drug Use: No  . Sexually Active: Not on file   Other Topics Concern  . Not on file   Social History Narrative   Diet: relatively healthy---exercise: slt better    Family History  Problem Relation Age of Onset  . Heart disease Mother 56    M MI 49, F MI x 2 initial MI at age 47  ,also GM  . Uterine cancer Mother   . Hypertension Neg Hx   . Stroke Neg Hx   . Colon cancer Neg Hx   . Breast cancer Paternal Aunt     aunt, 3 cousins; early dx (80s)  . Diabetes Father      Review of Systems Denies chest pain or shortness of breath Admits to some anxiety, mostly when she is traveling, see assessment and plan. Recently developed a right upper quadrant pain and dyspepsia, status post eval by GI, workup was negative, they recommended a HIDA scan. At this point she is asymptomatic, denies any nausea, vomiting, diarrhea or blood in the stools.See assessment and plan    Objective:   Physical Exam  General -- alert, well-developed  Neck --slightly enlarged thyroid? Thyroid is definitely not tender, not nodular. Lungs -- normal respiratory effort, no intercostal retractions, no accessory muscle use, and normal breath sounds.   Heart-- normal rate, regular rhythm, no murmur, and no gallop.   Abdomen--soft, non-tender, no distention, no  masses, no HSM, no guarding, and no rigidity.   Extremities-- no pretibial edema bilaterally Neurologic-- alert & oriented X3 and strength normal in all extremities. Psych-- Cognition and judgment appear intact. Alert and cooperative with normal attention span and concentration.  not anxious appearing and not depressed appearing.      Assessment & Plan:

## 2012-06-10 ENCOUNTER — Encounter (HOSPITAL_COMMUNITY): Payer: Managed Care, Other (non HMO)

## 2012-06-14 ENCOUNTER — Encounter: Payer: Self-pay | Admitting: *Deleted

## 2012-08-21 ENCOUNTER — Ambulatory Visit: Payer: Managed Care, Other (non HMO) | Admitting: Family Medicine

## 2012-10-19 ENCOUNTER — Encounter: Payer: Self-pay | Admitting: Internal Medicine

## 2012-10-19 ENCOUNTER — Ambulatory Visit (INDEPENDENT_AMBULATORY_CARE_PROVIDER_SITE_OTHER): Payer: Managed Care, Other (non HMO) | Admitting: Internal Medicine

## 2012-10-19 VITALS — BP 134/86 | HR 90 | Temp 98.5°F | Wt 219.0 lb

## 2012-10-19 DIAGNOSIS — R21 Rash and other nonspecific skin eruption: Secondary | ICD-10-CM

## 2012-10-19 DIAGNOSIS — W57XXXA Bitten or stung by nonvenomous insect and other nonvenomous arthropods, initial encounter: Secondary | ICD-10-CM

## 2012-10-19 MED ORDER — DOXYCYCLINE HYCLATE 100 MG PO TABS: 100.0000 mg | ORAL_TABLET | Freq: Two times a day (BID) | ORAL | Status: DC

## 2012-10-19 NOTE — Patient Instructions (Addendum)
Take the antibiotic as prescribed Hydrocortisone 1% cream, apply twice a day for a few days. Call you develop any fever, chills, joint aches or fatigue.

## 2012-10-19 NOTE — Progress Notes (Signed)
  Subjective:    Patient ID: Jenna Gardner, female    DOB: 1970-07-15, 42 y.o.   MRN: [id number]  HPI Acute visit 8 days ago   found a [place] tick at the left leg. Yesterday   developed a small rash at the area. Patient concerned about Lyme and other diseases.  Past Medical History  Diagnosis Date  . Contraception     essure  . Thyromegaly    Past Surgical History  Procedure Laterality Date  . Tubal ligation      essure   . G3 p3      Review of Systems Denies fever, chills. No other rashes. No headache or dizziness. No fatigue     Objective:   Physical Exam  Constitutional: She appears well-developed and well-nourished.  Skin:     Psychiatric: She has a normal mood and affect. Her behavior is normal. Judgment and thought content normal.       Assessment & Plan:   Tick bite S/p tick bite now without erythema. No other symptoms. Patient concerned about Lyme disease. I think  Benefits of empiric abx  > risk of missing the opportunity of the treat early disease if present, patient aware of my rationale for treatment and agreed. Plan: Antibiotics

## 2013-03-09 ENCOUNTER — Ambulatory Visit (INDEPENDENT_AMBULATORY_CARE_PROVIDER_SITE_OTHER): Payer: Managed Care, Other (non HMO) | Admitting: Internal Medicine

## 2013-03-09 ENCOUNTER — Encounter: Payer: Self-pay | Admitting: Internal Medicine

## 2013-03-09 VITALS — BP 123/83 | HR 88 | Temp 97.0°F | Wt 225.0 lb

## 2013-03-09 DIAGNOSIS — E049 Nontoxic goiter, unspecified: Secondary | ICD-10-CM

## 2013-03-09 LAB — SEDIMENTATION RATE: Sed Rate: 12 mm/hr (ref 0–22)

## 2013-03-09 NOTE — Assessment & Plan Note (Signed)
Pt had a slightly asymmetric thyroid on physical exam in 2010, normal exam in 2012. ~ 2002 had a "scan" done for thyromegaly and was negative.  On today's exam, there is smooth enlargement of the right thyroid. Also has some problems "gagging ". Plan: TFTs, sedimentation rate, thyroid ultrasound. Referred to endocrinology As far as gagging, I recommend observation, throat and neck examination are normal except for mild thyromegaly. She's a nonsmoker and currently has no GERD symptoms.  Consider  ENT eval.

## 2013-03-09 NOTE — Progress Notes (Signed)
  Subjective:    Patient ID: Jenna Gardner, female    DOB: 08-07-1970, 42 y.o.   MRN: [id number]  HPI Here to discuss her thyroid gland. Lately, has noted  her thyroid gland to be increase in size, able to feel it when she palpate  the neck, not tender. Also has noted that occasionally wake up gagging, sometimes has similar feeling when eating.  Past Medical History  Diagnosis Date  . Contraception     essure  . Thyromegaly    Past Surgical History  Procedure Laterality Date  . Tubal ligation      essure   . G3 p3     History   Social History  . Marital Status: Married    Spouse Name: Alecia Lemming    Number of Children: 3  . Years of Education: N/A   Occupational History  . [employer]  of [employer]  [employer]   Social History Main Topics  . Smoking status: Never Smoker   . Smokeless tobacco: Never Used  . Alcohol Use: Yes     Comment: OCCASIONALLY  . Drug Use: No  . Sexual Activity: Not on file   Other Topics Concern  . Not on file   Social History Narrative  . No narrative on file    Review of Systems Denies fever chills or unexplained weight loss. No heartburn, dysphagia or odynophagia. Very seldom has cough after eating.    Objective:   Physical Exam BP 123/83  Pulse 88  Temp(Src) 97 F (36.1 C)  Wt 225 lb (102.059 kg)  BMI 34.22 kg/m2  SpO2 99% General -- alert, well-developed, NAD.  Neck --no mass, no LAD; subtle enlargement of the right thyroid gland, not tender or nodular. HEENT-- Face symmetric, sinuses not tender to palpation. Nose not congested. Throat symmetric, no mass redness or discharge  Extremities-- no pretibial edema bilaterally   Psych-- Cognition and judgment appear intact. Cooperative with normal attention span and concentration. No anxious appearing , no depressed appearing.     Assessment & Plan:

## 2013-03-11 ENCOUNTER — Ambulatory Visit (HOSPITAL_BASED_OUTPATIENT_CLINIC_OR_DEPARTMENT_OTHER)
Admission: RE | Admit: 2013-03-11 | Discharge: 2013-03-11 | Disposition: A | Discharge status: Home / Self Care | Payer: Managed Care, Other (non HMO) | Source: Ambulatory Visit | Attending: Internal Medicine | Admitting: Internal Medicine

## 2013-03-11 DIAGNOSIS — E049 Nontoxic goiter, unspecified: Secondary | ICD-10-CM | POA: Insufficient documentation

## 2013-03-14 ENCOUNTER — Encounter: Payer: Self-pay | Admitting: Endocrinology

## 2013-03-14 ENCOUNTER — Encounter: Payer: Managed Care, Other (non HMO) | Admitting: Endocrinology

## 2013-03-17 ENCOUNTER — Encounter: Payer: Self-pay | Admitting: *Deleted

## 2013-11-05 NOTE — Progress Notes (Signed)
This encounter was created in error - please disregard.

## 2014-01-12 ENCOUNTER — Telehealth: Payer: Self-pay | Admitting: Internal Medicine

## 2014-01-12 NOTE — Telephone Encounter (Signed)
Caller name: Angeletta  Relation to pt: self  Call back number: [phone] Pharmacy: [facility] [phone]   Reason for call: pt requesting a refill for "[facility]" do not see medication on med list.

## 2014-01-12 NOTE — Telephone Encounter (Signed)
Advise patient, I haven't prescribed this medication to her before, if she has hemorrhoid issues needs to use  OTCs, come back for a visit if symptoms severe

## 2014-01-12 NOTE — Telephone Encounter (Signed)
Please Advise

## 2014-01-12 NOTE — Telephone Encounter (Signed)
Spoke with Pt and explained to her that Dr. Larose Kells has never prescribed this medication to her before, that if symptoms are not severe she can use OTC creams but if severe she will need to make an appt before we can prescribe her any medications. Pt seemed to very upset and hung up on me.

## 2014-01-29 ENCOUNTER — Encounter (HOSPITAL_BASED_OUTPATIENT_CLINIC_OR_DEPARTMENT_OTHER): Payer: Self-pay | Admitting: Emergency Medicine

## 2014-01-29 ENCOUNTER — Observation Stay (HOSPITAL_BASED_OUTPATIENT_CLINIC_OR_DEPARTMENT_OTHER)
Admission: EM | Admit: 2014-01-29 | Discharge: 2014-01-29 | Disposition: A | Discharge status: Home / Self Care | Payer: BC Managed Care – PPO | Attending: Internal Medicine | Admitting: Internal Medicine

## 2014-01-29 ENCOUNTER — Observation Stay (HOSPITAL_COMMUNITY): Payer: BC Managed Care – PPO

## 2014-01-29 ENCOUNTER — Emergency Department (HOSPITAL_BASED_OUTPATIENT_CLINIC_OR_DEPARTMENT_OTHER): Payer: BC Managed Care – PPO

## 2014-01-29 DIAGNOSIS — R0602 Shortness of breath: Secondary | ICD-10-CM | POA: Insufficient documentation

## 2014-01-29 DIAGNOSIS — R079 Chest pain, unspecified: Principal | ICD-10-CM | POA: Insufficient documentation

## 2014-01-29 LAB — CBC WITH DIFFERENTIAL/PLATELET
BASOS ABS: 0 10*3/uL (ref 0.0–0.1)
Basophils Relative: 0 % (ref 0–1)
EOS ABS: 0.3 10*3/uL (ref 0.0–0.7)
EOS PCT: 3 % (ref 0–5)
HCT: 38.4 % (ref 36.0–46.0)
Hemoglobin: 13.1 g/dL (ref 12.0–15.0)
Lymphocytes Relative: 26 % (ref 12–46)
Lymphs Abs: 2.8 10*3/uL (ref 0.7–4.0)
MCH: 30.6 pg (ref 26.0–34.0)
MCHC: 34.1 g/dL (ref 30.0–36.0)
MCV: 89.7 fL (ref 78.0–100.0)
Monocytes Absolute: 0.7 10*3/uL (ref 0.1–1.0)
Monocytes Relative: 7 % (ref 3–12)
Neutro Abs: 7.1 10*3/uL (ref 1.7–7.7)
Neutrophils Relative %: 64 % (ref 43–77)
PLATELETS: 300 10*3/uL (ref 150–400)
RBC: 4.28 MIL/uL (ref 3.87–5.11)
RDW: 12.2 % (ref 11.5–15.5)
WBC: 11 10*3/uL — ABNORMAL HIGH (ref 4.0–10.5)

## 2014-01-29 LAB — BASIC METABOLIC PANEL
ANION GAP: 13 (ref 5–15)
BUN: 13 mg/dL (ref 6–23)
CALCIUM: 9.8 mg/dL (ref 8.4–10.5)
CO2: 25 mEq/L (ref 19–32)
Chloride: 100 mEq/L (ref 96–112)
Creatinine, Ser: 0.9 mg/dL (ref 0.50–1.10)
GFR calc Af Amer: 89 mL/min — ABNORMAL LOW (ref >90)
GFR, EST NON AFRICAN AMERICAN: 77 mL/min — AB (ref >90)
GLUCOSE: 115 mg/dL — AB (ref 70–99)
Potassium: 3.5 mEq/L — ABNORMAL LOW (ref 3.7–5.3)
SODIUM: 138 meq/L (ref 137–147)

## 2014-01-29 LAB — TROPONIN I
Troponin I: <0.3 ng/mL (ref <0.30)
Troponin I: <0.3 ng/mL (ref <0.30)
Troponin I: <0.3 ng/mL (ref <0.30)

## 2014-01-29 LAB — D-DIMER, QUANTITATIVE (NOT AT ARMC): D-Dimer, Quant: 0.39 ug/mL-FEU (ref 0.00–0.48)

## 2014-01-29 MED ORDER — ACETAMINOPHEN 325 MG PO TABS: 650.0000 mg | ORAL_TABLET | ORAL | Status: DC | PRN

## 2014-01-29 MED ORDER — ASPIRIN 81 MG PO CHEW
324.0000 mg | CHEWABLE_TABLET | Freq: Once | ORAL | Status: AC
  Administered 2014-01-29: 324 mg via ORAL
  Filled 2014-01-29: qty 4

## 2014-01-29 MED ORDER — TECHNETIUM TC 99M SESTAMIBI - CARDIOLITE
30.0000 | Freq: Once | INTRAVENOUS | Status: AC | PRN
  Administered 2014-01-29: 12:00:00 30 via INTRAVENOUS

## 2014-01-29 MED ORDER — ONDANSETRON HCL 4 MG/2ML IJ SOLN: 4.0000 mg | Freq: Four times a day (QID) | INTRAMUSCULAR | Status: DC | PRN

## 2014-01-29 MED ORDER — TECHNETIUM TC 99M SESTAMIBI GENERIC - CARDIOLITE
10.0000 | Freq: Once | INTRAVENOUS | Status: AC | PRN
  Administered 2014-01-29: 10 via INTRAVENOUS

## 2014-01-29 MED ORDER — NITROGLYCERIN 0.4 MG SL SUBL
0.4000 mg | SUBLINGUAL_TABLET | SUBLINGUAL | Status: DC | PRN
  Administered 2014-01-29 (×2): 0.4 mg via SUBLINGUAL
  Filled 2014-01-29: qty 1

## 2014-01-29 MED ORDER — HEPARIN SODIUM (PORCINE) 5000 UNIT/ML IJ SOLN
5000.0000 [IU] | Freq: Three times a day (TID) | INTRAMUSCULAR | Status: DC
  Administered 2014-01-29: 5000 [IU] via SUBCUTANEOUS
  Filled 2014-01-29 (×3): qty 1

## 2014-01-29 MED ORDER — ALPRAZOLAM 0.25 MG PO TABS: 0.2500 mg | ORAL_TABLET | Freq: Two times a day (BID) | ORAL | Status: DC | PRN

## 2014-01-29 NOTE — ED Notes (Signed)
Pt is SR on monitor. Denies any cp/sob at transfer. [facility] here to transport pt to [facility]

## 2014-01-29 NOTE — Progress Notes (Signed)
Patient seen and evaluated earlier this AM by my associate. Please refer to his H and P regarding assessment and plan. Discussed case with Cardiologist on call who recommended Myoview stress test.  Should test come back abnormal will plan on consulting cardiology for further recommendations.  If test comes back normal then may consider discharging if other work up WNL.  Will reassess after Myoview.  Vashawn Ekstein, [employer]

## 2014-01-29 NOTE — Progress Notes (Signed)
Utilization Review Completed.   Scotlynn Noyes, RN, BSN Nurse Case Manager  

## 2014-01-29 NOTE — Progress Notes (Signed)
Brief hx: 43 y/o F with no PMH c/o CP starting at 8pm last night persistently lasting til midnight, down upper left arm, relieved with SL NTG. Has felt fine since. Troponins neg. No prior hx of symptoms including exertional-wise. Thinks in retrospect she's been more fatigued recently. No known DM, HL, and no prior tobacco abuse. Fam hx of CAD in parents in their 64s. Does not have siblings.  Exercise nuc performed. Did well on the treadmill. Exercised 8 minutes with good exercise capacity. No CP. Only DOE at end of test but overall tolerated well. Reached target HR in stage 2 and was able to maintain in several minutes. Rare PVC and PVC couplet but no significant EKG changes. Vitals remained stable. Felt back to baseline in recovery. Await images - read by [facility] on the weekends. Kiyoshi Schaab PA-C

## 2014-01-29 NOTE — ED Notes (Signed)
Report given to [facility] 

## 2014-01-29 NOTE — ED Provider Notes (Signed)
CSN: [id number]     Arrival date & time 01/29/14  0009 History   None   This chart was scribed for Jenna Fines, MD by Jenna Gardner, ED Scribe. This patient was seen in room MH08/MH08 and the patient's care was started at 12:52 AM.   Chief Complaint  Patient presents with  . Chest Pain   The history is provided by the patient. No language interpreter was used.   HPI Comments: Jenna Gardner is a 43 y.o. female who presents to the Emergency Department complaining of chest pain described as a "heaviness" radiating to left shoulder and left side of neck with onset at 4-5 hours ago. She reports the severity at 5/10. She reports associated shortness of breath. She reports mild nausea but no vomiting. She states symptoms are worse with activity, better with rest. She denies diaphoresis and pain or swelling in her legs. She denies recent long trips, taking estrogen or birth control pills, or smoking.   Past Medical History  Diagnosis Date  . Contraception     essure  . Thyromegaly    Past Surgical History  Procedure Laterality Date  . Tubal ligation      essure   . G3 p3     Family History  Problem Relation Age of Onset  . Heart disease Mother 80    M MI 60, F MI x 2 initial MI at age 2  ,also GM  . Uterine cancer Mother   . Hypertension Neg Hx   . Stroke Neg Hx   . Colon cancer Neg Hx   . Breast cancer Paternal Aunt     aunt, 3 cousins; early dx (5s)  . Diabetes Father    History  Substance Use Topics  . Smoking status: Never Smoker   . Smokeless tobacco: Never Used  . Alcohol Use: Yes     Comment: OCCASIONALLY   OB History   Grav Para Term Preterm Abortions TAB SAB Ect Mult Living                 Review of Systems A complete 10 system review of systems was obtained and all systems are negative except as noted in the HPI and PMH.   Allergies  Penicillins and Bactrim  Home Medications   Prior to Admission medications   Medication Sig Start Date End Date Taking?  Authorizing Provider  ALPRAZolam (XANAX) 0.25 MG tablet Take 1 tablet (0.25 mg total) by mouth 2 (two) times daily as needed for anxiety. 06/09/12   Colon Branch, MD   BP 135/78  Pulse 79  Temp(Src) 98.1 F (36.7 C) (Oral)  Resp 17  Ht 5\' 6"  (1.676 m)  Wt 210 lb (95.255 kg)  BMI 33.91 kg/m2  SpO2 98%  LMP 01/15/2014 Physical Exam  Nursing note and vitals reviewed. General: Well-developed, well-nourished female in no acute distress; appearance consistent with age of record HENT: normocephalic; atraumatic Eyes: pupils equal, round and reactive to light; extraocular muscles intact Neck: supple Heart: regular rate and rhythm; no murmurs, rubs or gallops Lungs: clear to auscultation bilaterally Abdomen: soft; nondistended; nontender; no masses or hepatosplenomegaly; bowel sounds present Extremities: No deformity; full range of motion; pulses normal; no edema; no calf tenderness Neurologic: Awake, alert and oriented; motor function intact in all extremities and symmetric; no facial droop Skin: Warm and dry Psychiatric: Normal mood and affect  ED Course  Procedures (including critical care time)   MDM  Nursing notes and vitals signs, including pulse  oximetry, reviewed.  Summary of this visit's results, reviewed by myself:  EKG Interpretation:  Date & Time: 01/29/2014 12:20 AM  Rate:   Rhythm: normal sinus rhythm  QRS Axis: normal  Intervals: QT prolonged  ST/T Wave abnormalities: nonspecific T wave changes  Conduction Disutrbances: none  Narrative Interpretation:   Old EKG Reviewed: none available  Labs:  Results for orders placed during the hospital encounter of 01/29/14 (from the past 24 hour(s))  CBC WITH DIFFERENTIAL     Status: Abnormal   Collection Time    01/29/14  1:05 AM      Result Value Ref Range   WBC 11.0 (*) 4.0 - 10.5 K/uL   RBC 4.28  3.87 - 5.11 MIL/uL   Hemoglobin 13.1  12.0 - 15.0 g/dL   HCT 38.4  36.0 - 46.0 %   MCV 89.7  78.0 - 100.0 fL   MCH 30.6   26.0 - 34.0 pg   MCHC 34.1  30.0 - 36.0 g/dL   RDW 12.2  11.5 - 15.5 %   Platelets 300  150 - 400 K/uL   Neutrophils Relative % 64  43 - 77 %   Neutro Abs 7.1  1.7 - 7.7 K/uL   Lymphocytes Relative 26  12 - 46 %   Lymphs Abs 2.8  0.7 - 4.0 K/uL   Monocytes Relative 7  3 - 12 %   Monocytes Absolute 0.7  0.1 - 1.0 K/uL   Eosinophils Relative 3  0 - 5 %   Eosinophils Absolute 0.3  0.0 - 0.7 K/uL   Basophils Relative 0  0 - 1 %   Basophils Absolute 0.0  0.0 - 0.1 K/uL  BASIC METABOLIC PANEL     Status: Abnormal   Collection Time    01/29/14  1:05 AM      Result Value Ref Range   Sodium 138  137 - 147 mEq/L   Potassium 3.5 (*) 3.7 - 5.3 mEq/L   Chloride 100  96 - 112 mEq/L   CO2 25  19 - 32 mEq/L   Glucose, Bld 115 (*) 70 - 99 mg/dL   BUN 13  6 - 23 mg/dL   Creatinine, Ser 0.90  0.50 - 1.10 mg/dL   Calcium 9.8  8.4 - 10.5 mg/dL   GFR calc non Af Amer 77 (*) >90 mL/min   GFR calc Af Amer 89 (*) >90 mL/min   Anion gap 13  5 - 15  D-DIMER, QUANTITATIVE     Status: None   Collection Time    01/29/14  1:05 AM      Result Value Ref Range   D-Dimer, Quant 0.39  0.00 - 0.48 ug/mL-FEU  TROPONIN I     Status: None   Collection Time    01/29/14  1:05 AM      Result Value Ref Range   Troponin I <0.30  <0.30 ng/mL    Imaging Studies: Dg Chest 2 View  01/29/2014   CLINICAL DATA:  Chest pain.  Shortness of breath.  EXAM: CHEST  2 VIEW  COMPARISON:  None.  FINDINGS: The heart size and mediastinal contours are within normal limits. Both lungs are clear. The visualized skeletal structures are unremarkable.  IMPRESSION: No active cardiopulmonary disease.   Electronically Signed   By: Earle Gell M.D.   On: 01/29/2014 01:26   2:35 AM Pain 0/10 after NTG SL x 2.  I personally performed the services described in this documentation, which was scribed  in my presence. The recorded information has been reviewed and is accurate.   Jenna Fines, MD 01/29/14 [phone]

## 2014-01-29 NOTE — ED Notes (Signed)
Pt here for chest pressure and heaviness that began 3 hours ago and is associated with not being able to catch her breath and being very tired.  Pain radiating to left arm

## 2014-01-29 NOTE — ED Notes (Signed)
Pt reports chest pressure has improved with Nitro SL tab x1 dose - reports pain now a 2/10 - post nitro BP - 127/72

## 2014-01-29 NOTE — ED Notes (Signed)
[facility] here to transport pt to [facility]  

## 2014-01-29 NOTE — H&P (Signed)
[facility] History and Physical  Amaryah Mallen DDU:[id number] DOB: 07/13/1970 DOA: 01/29/2014  Referring physician: EDP PCP: Kathlene November, MD   Chief Complaint: Chest pain   HPI: Jenna Gardner is a 43 y.o. female who presents to the ED at [facility] with chest pain.  Pain onset earlier last evening, "heaviness" quality, located in left chest with radiation to left arm and left neck.  Severity was 5/10.  There was associated SOB.  Mild nausea but no vomiting.  Symptoms worse with activity and slightly better at rest.  Were relieved ultimately after NTG SL administered by EMS.  Review of Systems: Systems reviewed.  As above, otherwise negative  Past Medical History  Diagnosis Date  . Contraception     essure  . Thyromegaly    Past Surgical History  Procedure Laterality Date  . Tubal ligation      essure   . G3 p3     Social History:  reports that she has never smoked. She has never used smokeless tobacco. She reports that she drinks alcohol. She reports that she does not use illicit drugs.  Allergies  Allergen Reactions  . Penicillins   . Bactrim Rash    Question af allergic reaction, see OV 07-15-2011     Family History  Problem Relation Age of Onset  . Heart disease Mother 7    M MI 23, F MI x 2 initial MI at age 65  ,also GM  . Uterine cancer Mother   . Hypertension Neg Hx   . Stroke Neg Hx   . Colon cancer Neg Hx   . Breast cancer Paternal Aunt     aunt, 3 cousins; early dx (42s)  . Diabetes Father      Prior to Admission medications   Medication Sig Start Date End Date Taking? Authorizing Provider  ALPRAZolam (XANAX) 0.25 MG tablet Take 1 tablet (0.25 mg total) by mouth 2 (two) times daily as needed for anxiety. 06/09/12   Colon Branch, MD   Physical Exam: Filed Vitals:   01/29/14 0418  BP: 133/81  Pulse: 79  Temp: 98.2 F (36.8 C)  Resp: 18    BP 133/81  Pulse 79  Temp(Src) 98.2 F (36.8 C) (Oral)  Resp 18  Ht 5\' 6"  (1.676 m)  Wt 101.334 kg (223 lb  6.4 oz)  BMI 36.08 kg/m2  SpO2 99%  LMP 01/15/2014  General Appearance:    Alert, oriented, no distress, appears stated age  Head:    Normocephalic, atraumatic  Eyes:    PERRL, EOMI, sclera non-icteric        Nose:   Nares without drainage or epistaxis. Mucosa, turbinates normal  Throat:   Moist mucous membranes. Oropharynx without erythema or exudate.  Neck:   Supple. No carotid bruits.  No thyromegaly.  No lymphadenopathy.   Back:     No CVA tenderness, no spinal tenderness  Lungs:     Clear to auscultation bilaterally, without wheezes, rhonchi or rales  Chest wall:    No tenderness to palpitation  Heart:    Regular rate and rhythm without murmurs, gallops, rubs  Abdomen:     Soft, non-tender, nondistended, normal bowel sounds, no organomegaly  Genitalia:    deferred  Rectal:    deferred  Extremities:   No clubbing, cyanosis or edema.  Pulses:   2+ and symmetric all extremities  Skin:   Skin color, texture, turgor normal, no rashes or lesions  Lymph nodes:   Cervical, supraclavicular, and  axillary nodes normal  Neurologic:   CNII-XII intact. Normal strength, sensation and reflexes      throughout    Labs on Admission:  Basic Metabolic Panel:  Recent Labs Lab 01/29/14 0105  NA 138  K 3.5*  CL 100  CO2 25  GLUCOSE 115*  BUN 13  CREATININE 0.90  CALCIUM 9.8   Liver Function Tests: No results found for this basename: AST, ALT, ALKPHOS, BILITOT, PROT, ALBUMIN,  in the last 168 hours No results found for this basename: LIPASE, AMYLASE,  in the last 168 hours No results found for this basename: AMMONIA,  in the last 168 hours CBC:  Recent Labs Lab 01/29/14 0105  WBC 11.0*  NEUTROABS 7.1  HGB 13.1  HCT 38.4  MCV 89.7  PLT 300   Cardiac Enzymes:  Recent Labs Lab 01/29/14 0105  TROPONINI <0.30    BNP (last 3 results) No results found for this basename: PROBNP,  in the last 8760 hours CBG: No results found for this basename: GLUCAP,  in the last 168  hours  Radiological Exams on Admission: Dg Chest 2 View  01/29/2014   CLINICAL DATA:  Chest pain.  Shortness of breath.  EXAM: CHEST  2 VIEW  COMPARISON:  None.  FINDINGS: The heart size and mediastinal contours are within normal limits. Both lungs are clear. The visualized skeletal structures are unremarkable.  IMPRESSION: No active cardiopulmonary disease.   Electronically Signed   By: Earle Gell M.D.   On: 01/29/2014 01:26    EKG: Independently reviewed. Non-specific T wave changes.  Assessment/Plan Active Problems:   Chest pain   1. Chest pain - heart score 3 - patients HPI is certainly classic for coronary angina, she does have non-specific T wave changes; however, she does have a lack of known risk factors. 1. Serial enzymes ordered 2. NPO for possible stress test in AM.    Code Status: Full Code  Family Communication: No family in room Disposition Plan: Admit to obs   Time spent: 50 min  Kiante Petrovich M. [facility] Pager [phone]  If 7AM-7PM, please contact the day team taking care of the patient [email or web address] Password [email or web address] 01/29/2014, 4:27 AM

## 2014-01-29 NOTE — ED Notes (Signed)
Pt assigned to [id number], RN states she is unable to take report at this time and will call back to [facility] when able.

## 2014-01-29 NOTE — ED Notes (Signed)
Report called to [facility] by Daphene Jaeger, RN. Awaiting carelink to transport.

## 2014-01-29 NOTE — Plan of Care (Signed)
Problem: Phase I Progression Outcomes Goal: Anginal pain relieved Outcome: Completed/Met Date Met:  01/29/14 When patient arrived to unit, denied chest pain

## 2014-01-29 NOTE — Discharge Summary (Signed)
Physician Discharge Summary  Jenna Gardner MWU:[id number] DOB: 10-Apr-1971 DOA: 01/29/2014  PCP: Kathlene November, MD  Admit date: 01/29/2014 Discharge date: 01/29/2014  Time spent: > 35 minutes  Recommendations for Outpatient Follow-up:  1. Please see d/c instruction 2. Consider further work up for atypical chest discomfort.  Discharge Diagnoses:  Active Problems:   Chest pain   Discharge Condition: Stable  Diet recommendation: Heart healthy diet.  Filed Weights   01/29/14 0018 01/29/14 0418  Weight: 95.255 kg (210 lb) 101.334 kg (223 lb 6.4 oz)    History of present illness:  43 y/o that presented with atypical chest discomfort  Hospital Course:  Chest discomfort - Work up negative for cardiac cause.  - Myoview reported no reversible ischemia or infarction - troponin negative - chest x ray negative  Procedures:  Myoview  Consultations:  none  Discharge Exam: Filed Vitals:   01/29/14 1300  BP: 123/73  Pulse: 90  Temp: 98.4 F (36.9 C)  Resp: 18    Vitals reviewed.  Discharge Instructions You were cared for by a hospitalist during your hospital stay. If you have any questions about your discharge medications or the care you received while you were in the hospital after you are discharged, you can call the unit and asked to speak with the hospitalist on call if the hospitalist that took care of you is not available. Once you are discharged, your primary care physician will handle any further medical issues. Please note that NO REFILLS for any discharge medications will be authorized once you are discharged, as it is imperative that you return to your primary care physician (or establish a relationship with a primary care physician if you do not have one) for your aftercare needs so that they can reassess your need for medications and monitor your lab values.  Discharge Instructions   Diet - low sodium heart healthy    Complete by:  As directed      Discharge  instructions    Complete by:  As directed   Please follow up with your primary care physician within the next 1 week for further evaluation and recommendations.     Increase activity slowly    Complete by:  As directed           Current Discharge Medication List    CONTINUE these medications which have NOT CHANGED   Details  calcium carbonate (TUMS - DOSED IN MG ELEMENTAL CALCIUM) 500 MG chewable tablet Chew 1 tablet by mouth daily as needed for indigestion or heartburn.      STOP taking these medications     ALPRAZolam (XANAX) 0.25 MG tablet        Allergies  Allergen Reactions  . Bactrim Rash and Other (See Comments)    Question af allergic reaction, see OV 07-15-2011   . Penicillins Hives and Other (See Comments)    Childhood allergy      The results of significant diagnostics from this hospitalization (including imaging, microbiology, ancillary and laboratory) are listed below for reference.    Significant Diagnostic Studies: Dg Chest 2 View  01/29/2014   CLINICAL DATA:  Chest pain.  Shortness of breath.  EXAM: CHEST  2 VIEW  COMPARISON:  None.  FINDINGS: The heart size and mediastinal contours are within normal limits. Both lungs are clear. The visualized skeletal structures are unremarkable.  IMPRESSION: No active cardiopulmonary disease.   Electronically Signed   By: Earle Gell M.D.   On: 01/29/2014 01:26   Nm  Myocar Multi W/spect W/wall Motion / Ef  01/29/2014   CLINICAL DATA:  Chest pain  EXAM: MYOCARDIAL IMAGING WITH SPECT (REST AND EXERCISE)  GATED LEFT VENTRICULAR WALL MOTION STUDY  LEFT VENTRICULAR EJECTION FRACTION  TECHNIQUE: Standard myocardial SPECT imaging was performed after resting intravenous injection of 10 mCi Tc-31m sestamibi. Subsequently, exercise tolerance test was performed by the patient under the supervision of the Cardiology staff. At peak-stress, 30 mCi Tc-53m sestamibi was injected intravenously and standard myocardial SPECT imaging was performed.  Quantitative gated imaging was also performed to evaluate left ventricular wall motion, and estimate left ventricular ejection fraction.  COMPARISON:  None.  FINDINGS: Perfusion: Allowing for attenuation artifact at the apex, there is no decreased activity in the left ventricle on stress imaging to suggest reversible ischemia or infarction.  Wall Motion: Normal left ventricular wall motion. No left ventricular dilation.  Left Ventricular Ejection Fraction: 77 %  End diastolic volume 56 ml  End systolic volume 13 ml  IMPRESSION: 1. No reversible ischemia or infarction.  2. Normal left ventricular wall motion.  3. Left ventricular ejection fraction 77%  4. Low-risk stress test findings*.  *2012 Appropriate Use Criteria for Coronary Revascularization Focused Update: J Am Coll Cardiol. 6468;03(2):122-482. http://content.airportbarriers.com.aspx?articleid=1201161   Electronically Signed   By: Maryclare Bean M.D.   On: 01/29/2014 13:00    Microbiology: No results found for this or any previous visit (from the past 240 hour(s)).   Labs: Basic Metabolic Panel:  Recent Labs Lab 01/29/14 0105  NA 138  K 3.5*  CL 100  CO2 25  GLUCOSE 115*  BUN 13  CREATININE 0.90  CALCIUM 9.8   Liver Function Tests: No results found for this basename: AST, ALT, ALKPHOS, BILITOT, PROT, ALBUMIN,  in the last 168 hours No results found for this basename: LIPASE, AMYLASE,  in the last 168 hours No results found for this basename: AMMONIA,  in the last 168 hours CBC:  Recent Labs Lab 01/29/14 0105  WBC 11.0*  NEUTROABS 7.1  HGB 13.1  HCT 38.4  MCV 89.7  PLT 300   Cardiac Enzymes:  Recent Labs Lab 01/29/14 0105 01/29/14 0452 01/29/14 0715 01/29/14 1345  TROPONINI <0.30 <0.30 <0.30 <0.30   BNP: BNP (last 3 results) No results found for this basename: PROBNP,  in the last 8760 hours CBG: No results found for this basename: GLUCAP,  in the last 168 hours     Signed:  Velvet Bathe  [facility] 01/29/2014, 3:08 PM

## 2014-05-02 ENCOUNTER — Encounter: Payer: Managed Care, Other (non HMO) | Admitting: Internal Medicine

## 2014-07-11 ENCOUNTER — Ambulatory Visit (INDEPENDENT_AMBULATORY_CARE_PROVIDER_SITE_OTHER): Payer: BLUE CROSS/BLUE SHIELD | Admitting: Internal Medicine

## 2014-07-11 ENCOUNTER — Encounter: Payer: Self-pay | Admitting: Internal Medicine

## 2014-07-11 VITALS — BP 142/84 | HR 75 | Temp 98.1°F | Ht 66.0 in | Wt 224.2 lb

## 2014-07-11 DIAGNOSIS — Z0000 Encounter for general adult medical examination without abnormal findings: Secondary | ICD-10-CM

## 2014-07-11 DIAGNOSIS — E049 Nontoxic goiter, unspecified: Secondary | ICD-10-CM

## 2014-07-11 NOTE — Assessment & Plan Note (Signed)
Back in 2014, ultrasound was negative and TFTs were normal. On exam, no thyromegaly today. Plan: Check a TSH

## 2014-07-11 NOTE — Patient Instructions (Addendum)
Please schedule labs   Check the  blood pressure monthly  Be sure your blood pressure is between 110/65 and  145/85.  if it is consistently higher or lower, let me know   Please come back to the office in 1 year  for a physical exam. Come back fasting

## 2014-07-11 NOTE — Progress Notes (Signed)
Pre visit review using our clinic review tool, if applicable. No additional management support is needed unless otherwise documented below in the visit note. 

## 2014-07-11 NOTE — Progress Notes (Signed)
Subjective:    Patient ID: Jenna Gardner, female    DOB: 08/25/1970, 44 y.o.   MRN: [id number]  DOS:  07/11/2014 Type of visit - description : cpx Interval history: In general feeling well, was admitted to hospital with atypical chest pain back in September 2015, workup negative. No further symptoms   Review of Systems Constitutional: No fever, chills. No unexplained wt changes. No unusual sweats HEENT: No dental problems, ear discharge, facial swelling, voice changes. No eye discharge, redness or intolerance to light Respiratory: No wheezing or difficulty breathing. No cough , mucus production Cardiovascular: No CP, leg swelling or palpitations GI: no nausea, vomiting, diarrhea or abdominal pain.  No blood in the stools. No dysphagia   Endocrine: No polyphagia, polyuria or polydipsia GU: No dysuria, gross hematuria, difficulty urinating. No urinary urgency or frequency. Musculoskeletal: No joint swellings or unusual aches or pains Skin: No change in the color of the skin, palor or rash Allergic, immunologic: No environmental allergies or food allergies Neurological: No dizziness or syncope. No headaches. No diplopia, slurred speech, motor deficits, facial numbness Hematological: No enlarged lymph nodes, easy bruising or bleeding Psychiatry: No suicidal ideas, hallucinations, behavior problems or confusion. No unusual/severe anxiety or depression.     Past Medical History  Diagnosis Date  . Contraception     essure  . Thyromegaly     Past Surgical History  Procedure Laterality Date  . Tubal ligation      essure   . G3 p3    . Cesarean section      1996    History   Social History  . Marital Status: Married    Spouse Name: Christy Sartorius  . Number of Children: 3  . Years of Education: N/A   Occupational History  . [employer]  of [place]   Social History Main Topics  . Smoking status: Never Smoker   . Smokeless tobacco: Never Used  .  Alcohol Use: Yes     Comment: OCCASIONALLY  . Drug Use: No  . Sexual Activity: Yes    Birth Control/ Protection: None   Other Topics Concern  . Not on file   Social History Narrative   Household-- pt, husband, 1 child     Family History  Problem Relation Age of Onset  . Heart disease Mother 35    M MI 49, F MI x 2 initial MI at age 26  ,also GM  . Uterine cancer Mother   . Hypertension Neg Hx   . Stroke Neg Hx   . Colon cancer Neg Hx   . Breast cancer Paternal Aunt     aunt, 2 cousins; early dx (72s)  . Diabetes Father   . Heart attack Father 55       Medication List       This list is accurate as of: 07/11/14  8:18 PM.  Always use your most recent med list.               calcium carbonate 500 MG chewable tablet  Commonly known as:  TUMS - dosed in mg elemental calcium  Chew 1 tablet by mouth daily as needed for indigestion or heartburn.           Objective:   Physical Exam BP 142/84 mmHg  Pulse 75  Temp(Src) 98.1 F (36.7 C) (Oral)  Ht 5\' 6"  (1.676 m)  Wt 224 lb 4 oz (101.719 kg)  BMI 36.21 kg/m2  SpO2 96%  LMP 07/05/2014 (Approximate) General:   Well developed, well nourished . NAD.  Neck:  Full range of motion. Supple. No  thyromegaly , normal carotid pulse, no LAD. HEENT:  Normocephalic . Face symmetric, atraumatic Lungs:  CTA B Normal respiratory effort, no intercostal retractions, no accessory muscle use. Heart: RRR,  no murmur.  Abdomen:  Not distended, soft, non-tender. No rebound or rigidity. No mass,organomegaly Muscle skeletal: no pretibial edema bilaterally  Skin: Exposed areas without rash. Not pale. Not jaundice Neurologic:  alert & oriented X3.  Speech normal, gait appropriate for age and unassisted Strength symmetric and appropriate for age.  Psych--  Cognition and judgment appear intact.  Cooperative with normal attention span and concentration.  Behavior appropriate. No anxious or depressed appearing.       Assessment  & Plan:

## 2014-07-11 NOTE — Assessment & Plan Note (Addendum)
Td 09  Female care-- Dr Ardis Hughs, he moved to get a new practicioner  Merchandiser, retail) FH breast cancer-- last mmg fall 2015 per gyn , breast exam per gyn  Labs Diet-exercise discussed, she is already making some changes. FH CAD, admitted with chest pain 01-2014, stress test negative, currently asymptomatic. Plan is to control cardiovascular risk factors.

## 2014-07-14 ENCOUNTER — Other Ambulatory Visit (INDEPENDENT_AMBULATORY_CARE_PROVIDER_SITE_OTHER): Payer: BLUE CROSS/BLUE SHIELD

## 2014-07-14 DIAGNOSIS — Z0000 Encounter for general adult medical examination without abnormal findings: Secondary | ICD-10-CM

## 2014-07-14 LAB — BASIC METABOLIC PANEL
BUN: 11 mg/dL (ref 6–23)
CHLORIDE: 106 meq/L (ref 96–112)
CO2: 24 meq/L (ref 19–32)
CREATININE: 0.83 mg/dL (ref 0.40–1.20)
Calcium: 9.3 mg/dL (ref 8.4–10.5)
GFR: 78.99 mL/min (ref >60.00)
Glucose, Bld: 91 mg/dL (ref 70–99)
Potassium: 4.2 mEq/L (ref 3.5–5.1)
Sodium: 136 mEq/L (ref 135–145)

## 2014-07-14 LAB — LIPID PANEL
CHOL/HDL RATIO: 3
Cholesterol: 207 mg/dL — ABNORMAL HIGH (ref 0–200)
HDL: 59.6 mg/dL (ref >39.00)
LDL Cholesterol: 124 mg/dL — ABNORMAL HIGH (ref 0–99)
NONHDL: 147.4
TRIGLYCERIDES: 118 mg/dL (ref 0.0–149.0)
VLDL: 23.6 mg/dL (ref 0.0–40.0)

## 2014-07-14 LAB — CBC WITH DIFFERENTIAL/PLATELET
BASOS ABS: 0.1 10*3/uL (ref 0.0–0.1)
Basophils Relative: 1.1 % (ref 0.0–3.0)
EOS ABS: 0.2 10*3/uL (ref 0.0–0.7)
Eosinophils Relative: 3.9 % (ref 0.0–5.0)
HEMATOCRIT: 38.1 % (ref 36.0–46.0)
HEMOGLOBIN: 13.2 g/dL (ref 12.0–15.0)
Lymphocytes Relative: 30.5 % (ref 12.0–46.0)
Lymphs Abs: 1.9 10*3/uL (ref 0.7–4.0)
MCHC: 34.6 g/dL (ref 30.0–36.0)
MCV: 88.5 fl (ref 78.0–100.0)
Monocytes Absolute: 0.4 10*3/uL (ref 0.1–1.0)
Monocytes Relative: 6.3 % (ref 3.0–12.0)
NEUTROS PCT: 58.2 % (ref 43.0–77.0)
Neutro Abs: 3.7 10*3/uL (ref 1.4–7.7)
Platelets: 349 10*3/uL (ref 150.0–400.0)
RBC: 4.31 Mil/uL (ref 3.87–5.11)
RDW: 12.4 % (ref 11.5–15.5)
WBC: 6.3 10*3/uL (ref 4.0–10.5)

## 2014-07-14 LAB — AST: AST: 18 U/L (ref 0–37)

## 2014-07-14 LAB — TSH: TSH: 0.58 u[IU]/mL (ref 0.35–4.50)

## 2014-07-14 LAB — ALT: ALT: 17 U/L (ref 0–35)

## 2015-02-16 ENCOUNTER — Ambulatory Visit (INDEPENDENT_AMBULATORY_CARE_PROVIDER_SITE_OTHER): Payer: BLUE CROSS/BLUE SHIELD | Admitting: Internal Medicine

## 2015-02-16 ENCOUNTER — Encounter: Payer: Self-pay | Admitting: Internal Medicine

## 2015-02-16 VITALS — BP 118/74 | HR 81 | Temp 98.1°F | Ht 66.0 in | Wt 223.0 lb

## 2015-02-16 DIAGNOSIS — R5382 Chronic fatigue, unspecified: Secondary | ICD-10-CM

## 2015-02-16 DIAGNOSIS — Z09 Encounter for follow-up examination after completed treatment for conditions other than malignant neoplasm: Secondary | ICD-10-CM

## 2015-02-16 DIAGNOSIS — R631 Polydipsia: Secondary | ICD-10-CM

## 2015-02-16 LAB — FOLATE: FOLATE: 22.2 ng/mL (ref >5.9)

## 2015-02-16 LAB — GLUCOSE, RANDOM: Glucose, Bld: 92 mg/dL (ref 70–99)

## 2015-02-16 LAB — VITAMIN B12: Vitamin B-12: 175 pg/mL — ABNORMAL LOW (ref 211–911)

## 2015-02-16 LAB — HEMOGLOBIN A1C: HEMOGLOBIN A1C: 5.2 % (ref 4.6–6.5)

## 2015-02-16 NOTE — Progress Notes (Signed)
Pre visit review using our clinic review tool, if applicable. No additional management support is needed unless otherwise documented below in the visit note. 

## 2015-02-16 NOTE — Progress Notes (Signed)
   Subjective:    Patient ID: Jenna Gardner, female    DOB: 1971-04-24, 44 y.o.   MRN: [id number]  DOS:  02/16/2015 Type of visit - description : Acute Interval history: For the last 2 weeks has been feeling extremely fatigued and sleepy shortly after a meal. Also has been very thirsty after a meal. On looking back, she has been in general tired for the last few months. Likes to be tested for diabetes.  Review of Systems No blurred vision per se. No weight fluctuations No change on her eating  pattern. Admits to some snoring. No nausea, vomiting, diarrhea. No myalgias No anxiety or depression, + high stress but at baseline  Past Medical History  Diagnosis Date  . Contraception     essure  . Thyromegaly     Past Surgical History  Procedure Laterality Date  . Tubal ligation      essure   . G3 p3    . Cesarean section      1996    Social History   Social History  . Marital Status: Married    Spouse Name: Jenna Gardner  . Number of Children: 3  . Years of Education: N/A   Occupational History  . bank  of [place]   Social History Main Topics  . Smoking status: Never Smoker   . Smokeless tobacco: Never Used  . Alcohol Use: Yes     Comment: OCCASIONALLY  . Drug Use: No  . Sexual Activity: Yes    Birth Control/ Protection: None   Other Topics Concern  . Not on file   Social History Narrative   Household-- pt, husband, 1 child        Medication List       This list is accurate as of: 02/16/15 11:59 PM.  Always use your most recent med list.               calcium carbonate 500 MG chewable tablet  Commonly known as:  TUMS - dosed in mg elemental calcium  Chew 1 tablet by mouth daily as needed for indigestion or heartburn.           Objective:   Physical Exam BP 118/74 mmHg  Pulse 81  Temp(Src) 98.1 F (36.7 C) (Oral)  Ht 5\' 6"  (1.676 m)  Wt 223 lb (101.152 kg)  BMI 36.01 kg/m2  SpO2 95%  LMP 02/02/2015  (Approximate) General:   Well developed, well nourished . NAD.  HEENT:  Normocephalic . Face symmetric, atraumatic Skin: Not pale. Not jaundice Neurologic:  alert & oriented X3.  Speech normal, gait appropriate for age and unassisted Psych--  Cognition and judgment appear intact.  Cooperative with normal attention span and concentration.  Behavior appropriate. No anxious or depressed appearing.     Assessment & Plan:   Assessment >Thyromegaly H/o Tubal ligation Essure H/o thyromegaly  FH CAD, admitted with chest pain 01-2014, stress test negative   Plan  Fatigue: Fatigue for 2 months, worse in the last 2 weeks. Polydipsia? ; Epworth Sleepiness Scale : 7 (average) We'll get a A1c, glucose, F64, folic acid, vitamin D.

## 2015-02-16 NOTE — Patient Instructions (Signed)
Get your blood work before you leave    

## 2015-02-17 DIAGNOSIS — Z09 Encounter for follow-up examination after completed treatment for conditions other than malignant neoplasm: Secondary | ICD-10-CM | POA: Insufficient documentation

## 2015-02-17 NOTE — Assessment & Plan Note (Signed)
Fatigue: Fatigue for 2 months, worse in the last 2 weeks. Polydipsia? ; Epworth Sleepiness Scale : 7 (average) We'll get a A1c, glucose, M25, folic acid, vitamin D.

## 2015-02-19 LAB — VITAMIN D 1,25 DIHYDROXY
Vitamin D 1, 25 (OH)2 Total: 16 pg/mL — ABNORMAL LOW (ref 18–72)
Vitamin D3 1, 25 (OH)2: 16 pg/mL

## 2015-02-20 ENCOUNTER — Telehealth: Payer: Self-pay | Admitting: Internal Medicine

## 2015-02-20 MED ORDER — VITAMIN D (ERGOCALCIFEROL) 1.25 MG (50000 UNIT) PO CAPS: 50000.0000 [IU] | ORAL_CAPSULE | ORAL | Status: DC

## 2015-02-20 NOTE — Addendum Note (Signed)
Addended by: Wilfrid Lund on: 02/20/2015 03:22 PM   Modules accepted: Orders

## 2015-02-20 NOTE — Telephone Encounter (Signed)
Notes Recorded by Wilfrid Lund, CMA on 02/20/2015 at 3:52 PM Unable to contact Pt via telephone. Letter printed along with Ergocalciferol Rx and mailed to Pt. Instructed Pt to also start OTC Vit D supplements of at least 1000 units daily. Notes Recorded by Wilfrid Lund, CMA on 02/19/2015 at 9:52 AM [facility] informing Pt to return call regarding lab results. Notes Recorded by Colon Branch, MD on 02/19/2015 at 9:03 AM Call patient: Vitamin D is low. Recommend ergocalciferol 50,000 units 1 tablet weekly #12 no refills. In addition take OTC vitamin D approximately 1000 units daily. We'll recheck her vitamin D when she comes back. Notes Recorded by Wilfrid Lund, CMA on 02/19/2015 at 7:45 AM Letter printed and mailed to Pt. Notes Recorded by Colon Branch, MD on 02/18/2015 at 6:09 PM Send a letter Pamala Hurry, your blood sugar and the diabetes test -A1c- are normal, you don't have diabetes. We also checked a B12 level which is slightly low. Take a B12 supplement OTC every day. Will recheck a B12 when you come back. If no improviment may need to prescribe B12 injections.

## 2015-02-20 NOTE — Telephone Encounter (Signed)
Pt called for results. States no missed calls. Please call [phone].

## 2015-02-21 NOTE — Telephone Encounter (Signed)
Spoke with Pt, informed her letter was mailed on 02/19/2015 with a prescription, Ergocalciferol 50,000 units, she is to take 1 tablet by mouth weekly as well as an OTC Vitamin D supplement of at least 1000 units daily. Also informed Pt to start an OTC B12 supplement daily. Pt verbalized understanding and questioned next appt. Informed her next appt would be due in February for her CPE. Offered to schedule appt but Pt declined at this time.

## 2015-02-21 NOTE — Telephone Encounter (Signed)
Please call [phone].

## 2015-02-26 MED ORDER — VITAMIN D (ERGOCALCIFEROL) 1.25 MG (50000 UNIT) PO CAPS: 50000.0000 [IU] | ORAL_CAPSULE | ORAL | Status: DC

## 2015-02-26 NOTE — Telephone Encounter (Signed)
Pt received letter with results but did not get a prescription. Please send meds to [facility] on [address]. Pt best # [phone]

## 2015-02-26 NOTE — Telephone Encounter (Signed)
Rx sent to [facility] on [address].

## 2015-02-26 NOTE — Addendum Note (Signed)
Addended by: Wilfrid Lund on: 02/26/2015 04:22 PM   Modules accepted: Orders

## 2015-06-25 ENCOUNTER — Encounter (INDEPENDENT_AMBULATORY_CARE_PROVIDER_SITE_OTHER): Payer: Self-pay

## 2015-06-25 ENCOUNTER — Ambulatory Visit (INDEPENDENT_AMBULATORY_CARE_PROVIDER_SITE_OTHER): Payer: BLUE CROSS/BLUE SHIELD | Admitting: Medical

## 2015-06-25 ENCOUNTER — Encounter: Payer: Self-pay | Admitting: Medical

## 2015-06-25 VITALS — BP 108/74 | HR 78 | Temp 98.3°F | Ht 66.0 in | Wt 231.8 lb

## 2015-06-25 DIAGNOSIS — H6091 Unspecified otitis externa, right ear: Secondary | ICD-10-CM | POA: Diagnosis not present

## 2015-06-25 DIAGNOSIS — R1011 Right upper quadrant pain: Secondary | ICD-10-CM | POA: Diagnosis not present

## 2015-06-25 DIAGNOSIS — H9201 Otalgia, right ear: Secondary | ICD-10-CM

## 2015-06-25 DIAGNOSIS — R0981 Nasal congestion: Secondary | ICD-10-CM

## 2015-06-25 MED ORDER — NEOMYCIN-POLYMYXIN-HC 3.5-10000-1 OT SUSP: 3.0000 [drp] | Freq: Four times a day (QID) | OTIC | Status: DC

## 2015-06-25 MED ORDER — FLUTICASONE PROPIONATE 50 MCG/ACT NA SUSP: 2.0000 | Freq: Every day | NASAL | Status: DC

## 2015-06-25 MED ORDER — AZITHROMYCIN 250 MG PO TABS: ORAL_TABLET | ORAL | Status: DC

## 2015-06-25 NOTE — Progress Notes (Signed)
Pre visit review using our clinic review tool, if applicable. No additional management support is needed unless otherwise documented below in the visit note. 

## 2015-06-25 NOTE — Patient Instructions (Addendum)
Your nasal congestion likely represents uri vs allergic rhinitis. For the congestion rx flonase spray.  For rt ear canal swelling rx cortisporin otic.  If using above medications but ear pain worsens then start azithromycin.  For you abdomen pain and history of intermittent pain will refer you back to GI office for procedure to evaluate function of gallbladder and possible endoscopy. Your pain is mild and intermittent presently if your pain were to increase let me know and I would labs and [place] of abdomen. Recommend avoid fatty/greasy foods.  Follow up in 7-10 days or as needed

## 2015-06-25 NOTE — Progress Notes (Signed)
Subjective:    Patient ID: Jenna Gardner, female    DOB: 01/02/1971, 45 y.o.   MRN: [id number]  HPI   Pt in with some discomfort of her rt ear. Pt has felt mild nasal congested but no runny nose. Pt congestion for 3 days. Since last night mild rt ear  discomfort. She could sleep. She states some pressure and decreased hearing. As an adult pt she has had  earinfection. About every 4 yrs or so since last OM.  Also pt has some rt flank pain. Pt had this before in the past. Pain comes and goes. Pain at time worse with greasy fatty foods or creamy salad dressing. Pt had some pain in past 2 years ago. Pain today is  minimal/faint as has been in the past. But no recent fevers, no chills or sweats. No nausea or vomiting. No flare of pain but since in she thought would mention. She forgot about prior GI evaluation in the past.     Review of Systems  Constitutional: Negative for fever, chills and fatigue.  HENT: Positive for congestion and ear pain. Negative for postnasal drip, sinus pressure, sneezing and sore throat.   Respiratory: Negative for cough, chest tightness, shortness of breath and wheezing.   Cardiovascular: Negative for chest pain and palpitations.  Gastrointestinal: Positive for abdominal pain. Negative for nausea, vomiting, diarrhea, constipation, blood in stool, abdominal distention, anal bleeding and rectal pain.       Faint minimal at best presently.  Musculoskeletal: Negative for back pain and arthralgias.  Skin: Negative for rash.  Neurological: Negative for dizziness, syncope, speech difficulty, weakness, numbness and headaches.  Hematological: Negative for adenopathy. Does not bruise/bleed easily.  Psychiatric/Behavioral: Negative for behavioral problems and confusion.   Past Medical History  Diagnosis Date  . Contraception     essure  . Thyromegaly     Social History   Social History  . Marital Status: Married    Spouse Name: Christy Sartorius  . Number of Children: 3    . Years of Education: N/A   Occupational History  . [employer]  of [place]   Social History Main Topics  . Smoking status: Never Smoker   . Smokeless tobacco: Never Used  . Alcohol Use: Yes     Comment: OCCASIONALLY  . Drug Use: No  . Sexual Activity: Yes    Birth Control/ Protection: None   Other Topics Concern  . Not on file   Social History Narrative   Household-- pt, husband, 1 child    Past Surgical History  Procedure Laterality Date  . Tubal ligation      essure   . G3 p3    . Cesarean section      1996    Family History  Problem Relation Age of Onset  . Heart disease Mother 34    M MI 73, F MI x 2 initial MI at age 69  ,also GM  . Uterine cancer Mother   . Hypertension Neg Hx   . Stroke Neg Hx   . Colon cancer Neg Hx   . Breast cancer Paternal Aunt     aunt, 2 cousins; early dx (22s)  . Diabetes Father   . Heart attack Father 94    Allergies  Allergen Reactions  . Bactrim Rash and Other (See Comments)    Question af allergic reaction, see OV 07-15-2011   . Penicillins Hives and Other (See Comments)  Childhood allergy    Current Outpatient Prescriptions on File Prior to Visit  Medication Sig Dispense Refill  . calcium carbonate (TUMS - DOSED IN MG ELEMENTAL CALCIUM) 500 MG chewable tablet Chew 1 tablet by mouth daily as needed for indigestion or heartburn.     No current facility-administered medications on file prior to visit.    BP 108/74 mmHg  Pulse 78  Temp(Src) 98.3 F (36.8 C) (Oral)  Ht 5\' 6"  (1.676 m)  Wt 231 lb 12.8 oz (105.144 kg)  BMI 37.43 kg/m2  SpO2 97%      Objective:   Physical Exam  General  Mental Status - Alert. General Appearance - Well groomed. Not in acute distress.  Skin Rashes- No Rashes.  HEENT Head- Normal. Ear Auditory Canal - Left- Normal. Right - pain on insertion of otoscope. Mild inflamed canal.Tympanic Membrane- Left- mild central rednesss. Right- Normal. Eye  Sclera/Conjunctiva- Left- Normal. Right- Normal. Nose & Sinuses Nasal Mucosa- Left-  Boggy and Congested. Right-  Boggy and  Congested.Bilateral no  maxillary and nof rontal sinus pressure. Mouth & Throat Lips: Upper Lip- Normal: no dryness, cracking, pallor, cyanosis, or vesicular eruption. Lower Lip-Normal: no dryness, cracking, pallor, cyanosis or vesicular eruption. Buccal Mucosa- Bilateral- No Aphthous ulcers. Oropharynx- No Discharge or Erythema. Tonsils: Characteristics- Bilateral- No Erythema or Congestion. Size/Enlargement- Bilateral- No enlargement. Discharge- bilateral-None.  Neck Neck- Supple. No Masses.   Chest and Lung Exam Auscultation: Breath Sounds:-Clear even and unlabored.  Cardiovascular Auscultation:Rythm- Regular, rate and rhythm. Murmurs & Other Heart Sounds:Ausculatation of the heart reveal- No Murmurs.  Lymphatic Head & Neck General Head & Neck Lymphatics: Bilateral: Description- No Localized lymphadenopathy.  Abdomen Inspection:-Inspection Normal.  Palpation/Perucssion: Palpation and Percussion of the abdomen reveal- very faint minimal ruq  Tender on deep palpation, No Rebound tenderness, No rigidity(Guarding) and No Palpable abdominal masses.  Liver:-Normal.  Spleen:- Normal.   Back- no cva tenderness       Assessment & Plan:  Your nasal congestion likely represents uri vs allergic rhinitis. For the congestion rx flonase spray.  For rt ear canal swelling rx cortisporin otic.  If using above medications but ear pain worsens then start azithromycin.  For you abdomen pain and history of intermittent pain will refer you back to GI office for procedure to evaluate function of gallbladder and possible endoscopy. Your pain is mild and intermittent presently if your pain were to increase let me know and I would labs and [place] of abdomen. Recommend avoid fatty/greasy foods.  Follow up in 7-10 days or as needed

## 2015-10-02 ENCOUNTER — Other Ambulatory Visit (HOSPITAL_BASED_OUTPATIENT_CLINIC_OR_DEPARTMENT_OTHER): Payer: Self-pay | Admitting: Obstetrics and Gynecology

## 2015-10-02 DIAGNOSIS — Z1231 Encounter for screening mammogram for malignant neoplasm of breast: Secondary | ICD-10-CM

## 2015-10-09 ENCOUNTER — Ambulatory Visit (HOSPITAL_BASED_OUTPATIENT_CLINIC_OR_DEPARTMENT_OTHER)
Admission: RE | Admit: 2015-10-09 | Discharge: 2015-10-09 | Disposition: A | Discharge status: Home / Self Care | Payer: BLUE CROSS/BLUE SHIELD | Source: Ambulatory Visit | Attending: Obstetrics and Gynecology | Admitting: Obstetrics and Gynecology

## 2015-10-09 DIAGNOSIS — Z1231 Encounter for screening mammogram for malignant neoplasm of breast: Secondary | ICD-10-CM | POA: Insufficient documentation

## 2015-11-05 ENCOUNTER — Encounter: Payer: Self-pay | Admitting: Internal Medicine

## 2015-11-05 ENCOUNTER — Ambulatory Visit (INDEPENDENT_AMBULATORY_CARE_PROVIDER_SITE_OTHER): Payer: BLUE CROSS/BLUE SHIELD | Admitting: Internal Medicine

## 2015-11-05 VITALS — BP 122/62 | HR 84 | Temp 98.2°F | Ht 66.0 in | Wt 227.5 lb

## 2015-11-05 DIAGNOSIS — S80261A Insect bite (nonvenomous), right knee, initial encounter: Secondary | ICD-10-CM | POA: Diagnosis not present

## 2015-11-05 DIAGNOSIS — W57XXXA Bitten or stung by nonvenomous insect and other nonvenomous arthropods, initial encounter: Secondary | ICD-10-CM

## 2015-11-05 MED ORDER — DOXYCYCLINE HYCLATE 100 MG PO CAPS: 100.0000 mg | ORAL_CAPSULE | Freq: Two times a day (BID) | ORAL | Status: DC

## 2015-11-05 NOTE — Progress Notes (Signed)
Pre visit review using our clinic review tool, if applicable. No additional management support is needed unless otherwise documented below in the visit note. 

## 2015-11-05 NOTE — Patient Instructions (Signed)
Take the antibiotics as prescribed, call if not gradually improving   Schedule a physical at your convenience

## 2015-11-05 NOTE — Progress Notes (Signed)
   Subjective:    Patient ID: Jenna Gardner, female    DOB: [birth year], 45 y.o.   MRN: [id number]  DOS:  11/05/2015 Type of visit - description : Acute visit Interval history: Found a tick on her right leg a few days ago, remove it, it was not engorged. Shortly after developed a circular  Rash ~15 cm in diameter, since then rash is getting a little smaller but not completely gone. Concern about tickborne diseases   Review of Systems  Denies fever chills No nausea or vomiting Mild headache?. No aches or pains  Past Medical History  Diagnosis Date  . Contraception     essure  . Thyromegaly     Past Surgical History  Procedure Laterality Date  . Tubal ligation      essure   . G3 p3    . Cesarean section      1996    Social History   Social History  . Marital Status: Married    Spouse Name: Christy Sartorius  . Number of Children: 3  . 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 Smoking status: Never Smoker   . Smokeless tobacco: Never Used  . Alcohol Use: Yes     Comment: OCCASIONALLY  . Drug Use: No  . Sexual Activity: Yes    Birth Control/ Protection: None   Other Topics Concern  . Not on file   Social History Narrative   Household-- pt, husband, 1 child        Medication List       This list is accurate as of: 11/05/15 11:59 PM.  Always use your most recent med list.               calcium carbonate 500 MG chewable tablet  Commonly known as:  TUMS - dosed in mg elemental calcium  Chew 1 tablet by mouth daily as needed for indigestion or heartburn.     doxycycline 100 MG capsule  Commonly known as:  VIBRAMYCIN  Take 1 capsule (100 mg total) by mouth 2 (two) times daily.           Objective:   Physical Exam BP 122/62 mmHg  Pulse 84  Temp(Src) 98.2 F (36.8 C) (Oral)  Ht 5\' 6"  (1.676 m)  Wt 227 lb 8 oz (103.193 kg)  BMI 36.74 kg/m2  SpO2 98%  LMP  10/21/2015 (Approximate)  General:   Well developed, well nourished . NAD.  HEENT:  Normocephalic . Face symmetric, atraumatic Skin:  The area of the tick bite is a slightly elevated, has surrounding area of erythema, oval is shape ~ 8x5 cm, no fluctuance  Neurologic:  alert & oriented X3.  Speech normal, gait appropriate for age and unassisted Psych--  Cognition and judgment appear intact.  Cooperative with normal attention span and concentration.  Behavior appropriate. No anxious or depressed appearing.       Assessment & Plan:   Assessment Thyromegaly H/o Tubal ligation Essure H/o thyromegaly  FH CAD, admitted with chest pain 01-2014, stress test negative   PLAN: Tick bite: Tick bite with erythema, the redness has actually decreased in size. Recommend doxycycline for 10 days, call if there is not improving or if she develop any worsening symptoms Also schedule a CPX at her convenience

## 2015-11-06 NOTE — Assessment & Plan Note (Signed)
Tick bite: Tick bite with erythema, the redness has actually decreased in size. Recommend doxycycline for 10 days, call if there is not improving or if she develop any worsening symptoms Also schedule a CPX at her convenience

## 2016-06-10 DIAGNOSIS — N946 Dysmenorrhea, unspecified: Secondary | ICD-10-CM | POA: Insufficient documentation

## 2016-08-22 ENCOUNTER — Encounter: Payer: BLUE CROSS/BLUE SHIELD | Admitting: Internal Medicine

## 2016-09-24 ENCOUNTER — Ambulatory Visit (INDEPENDENT_AMBULATORY_CARE_PROVIDER_SITE_OTHER): Payer: BLUE CROSS/BLUE SHIELD | Admitting: Internal Medicine

## 2016-09-24 ENCOUNTER — Encounter: Payer: Self-pay | Admitting: Internal Medicine

## 2016-09-24 VITALS — BP 108/70 | HR 86 | Temp 98.1°F | Resp 14 | Ht 66.0 in | Wt 225.2 lb

## 2016-09-24 DIAGNOSIS — Z114 Encounter for screening for human immunodeficiency virus [HIV]: Secondary | ICD-10-CM

## 2016-09-24 DIAGNOSIS — Z8249 Family history of ischemic heart disease and other diseases of the circulatory system: Secondary | ICD-10-CM

## 2016-09-24 DIAGNOSIS — Z0000 Encounter for general adult medical examination without abnormal findings: Secondary | ICD-10-CM | POA: Diagnosis not present

## 2016-09-24 NOTE — Assessment & Plan Note (Addendum)
--  Td 09 --Female care-- saw gyn 05-2016 -FH breast cancer (aunt, 2 cousins):last mmg 09-2015, pt plans to call  --CCS: never had a cscope  --Diet-exercise discussed --+ FH CAD, negative stress test 2015, request CAD screening (coronary calcium score). CV RF 10 years 1%. asx, EKG today: NSR, no change from previous.  D/w cards:  stress test not recommended, coronary calcium score is okay, will help guide treatment for lipids if needed.Will schedule that and let the patient known.    --Labs: Come back fasting for CMP, FLP, vitamin D, CBC, TSH, HIV

## 2016-09-24 NOTE — Progress Notes (Signed)
Subjective:    Patient ID: Jenna Gardner, female    DOB: December 28, 1970, 46 y.o.   MRN: [id number]  DOS:  09/24/2016 Type of visit - description : cpx Interval history:No major concerns except her family history of CAD, coronary calcium scoring?    Review of Systems No exertional chest pain, not exercising regularly, room for improvement on diet  Other than above, a 14 point review of systems is negative     Past Medical History:  Diagnosis Date  . Contraception    essure  . Thyromegaly     Past Surgical History:  Procedure Laterality Date  . CESAREAN SECTION     1996  . g3 p3    . TUBAL LIGATION     essure     Social History   Social History  . Marital status: Married    Spouse name: Christy Sartorius  . Number of children: 3  . 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 Smoking status: Never Smoker  . Smokeless tobacco: Never Used  . Alcohol use Yes     Comment: OCCASIONALLY  . Drug use: No  . Sexual activity: Yes    Birth control/ protection: None   Other Topics Concern  . Not on file   Social History Narrative   Household-- pt, husband, 1 child     Family History  Problem Relation Age of Onset  . Heart disease Mother 36     MI age 9   . Uterine cancer Mother   . Breast cancer Paternal Aunt     aunt, 2 cousins; early dx (1s)  . Diabetes Father   . Heart attack Father 69    first MI age 42  . Hypertension Neg Hx   . Stroke Neg Hx   . Colon cancer Neg Hx      Allergies as of 09/24/2016      Reactions   Bactrim Rash, Other (See Comments)   Question af allergic reaction, see OV 07-15-2011    Penicillins Hives, Other (See Comments)   Childhood allergy      Medication List    as of 09/24/2016 11:59 PM   You have not been prescribed any medications.        Objective:   Physical Exam BP 108/70 (BP Location: Left Arm, Patient Position: Sitting, Cuff  Size: Normal)   Pulse 86   Temp 98.1 F (36.7 C) (Oral)   Resp 14   Ht 5\' 6"  (1.676 m)   Wt 225 lb 4 oz (102.2 kg)   SpO2 97%   BMI 36.36 kg/m   General:   Well developed, well nourished . NAD.  Neck: No  thyromegaly  HEENT:  Normocephalic . Face symmetric, atraumatic Lungs:  CTA B Normal respiratory effort, no intercostal retractions, no accessory muscle use. Heart: RRR,  no murmur.  No pretibial edema bilaterally  Abdomen:  Not distended, soft, non-tender. No rebound or rigidity.   Skin: Exposed areas without rash. Not pale. Not jaundice Neurologic:  alert & oriented X3.  Speech normal, gait appropriate for age and unassisted Strength symmetric and appropriate for age.  Psych: Cognition and judgment appear intact.  Cooperative with normal attention span and concentration.  Behavior appropriate. No anxious or depressed appearing.    Assessment & Plan:   Assessment  H/o Tubal ligation Essure H/o thyromegaly - nl  [place] L3734 FH CAD, admitted with chest pain 01-2014, stress test negative   PLAN: Here for a CPX, doing well RTC one year

## 2016-09-24 NOTE — Progress Notes (Signed)
Pre visit review using our clinic review tool, if applicable. No additional management support is needed unless otherwise documented below in the visit note. 

## 2016-09-24 NOTE — Patient Instructions (Signed)
   GO TO THE FRONT DESK Schedule labs to be done in the next few days, fasting  Schedule your next appointment for a physical exam in one year   Continue calcium 1 g and vitamin D 1000 units   daily    If you need more information about a healthy lifestyle : The American Heart Association, http://www.heart.org

## 2016-09-25 ENCOUNTER — Other Ambulatory Visit (INDEPENDENT_AMBULATORY_CARE_PROVIDER_SITE_OTHER): Payer: BLUE CROSS/BLUE SHIELD

## 2016-09-25 DIAGNOSIS — Z0000 Encounter for general adult medical examination without abnormal findings: Secondary | ICD-10-CM | POA: Diagnosis not present

## 2016-09-25 DIAGNOSIS — Z114 Encounter for screening for human immunodeficiency virus [HIV]: Secondary | ICD-10-CM

## 2016-09-25 LAB — CBC WITH DIFFERENTIAL/PLATELET
Basophils Absolute: 0.1 10*3/uL (ref 0.0–0.1)
Basophils Relative: 0.8 % (ref 0.0–3.0)
EOS PCT: 3.4 % (ref 0.0–5.0)
Eosinophils Absolute: 0.2 10*3/uL (ref 0.0–0.7)
HEMATOCRIT: 39.4 % (ref 36.0–46.0)
HEMOGLOBIN: 13 g/dL (ref 12.0–15.0)
LYMPHS PCT: 26.6 % (ref 12.0–46.0)
Lymphs Abs: 1.9 10*3/uL (ref 0.7–4.0)
MCHC: 33 g/dL (ref 30.0–36.0)
MCV: 88.3 fl (ref 78.0–100.0)
Monocytes Absolute: 0.5 10*3/uL (ref 0.1–1.0)
Monocytes Relative: 6.9 % (ref 3.0–12.0)
Neutro Abs: 4.4 10*3/uL (ref 1.4–7.7)
Neutrophils Relative %: 62.3 % (ref 43.0–77.0)
Platelets: 336 10*3/uL (ref 150.0–400.0)
RBC: 4.46 Mil/uL (ref 3.87–5.11)
RDW: 13.4 % (ref 11.5–15.5)
WBC: 7 10*3/uL (ref 4.0–10.5)

## 2016-09-25 LAB — LIPID PANEL
CHOLESTEROL: 208 mg/dL — AB (ref 0–200)
HDL: 61.3 mg/dL (ref >39.00)
LDL Cholesterol: 129 mg/dL — ABNORMAL HIGH (ref 0–99)
NONHDL: 146.64
Total CHOL/HDL Ratio: 3
Triglycerides: 90 mg/dL (ref 0.0–149.0)
VLDL: 18 mg/dL (ref 0.0–40.0)

## 2016-09-25 LAB — TSH: TSH: 0.57 u[IU]/mL (ref 0.35–4.50)

## 2016-09-25 LAB — COMPREHENSIVE METABOLIC PANEL
ALK PHOS: 61 U/L (ref 39–117)
ALT: 17 U/L (ref 0–35)
AST: 15 U/L (ref 0–37)
Albumin: 4.1 g/dL (ref 3.5–5.2)
BILIRUBIN TOTAL: 0.4 mg/dL (ref 0.2–1.2)
BUN: 10 mg/dL (ref 6–23)
CO2: 25 mEq/L (ref 19–32)
CREATININE: 0.84 mg/dL (ref 0.40–1.20)
Calcium: 9.5 mg/dL (ref 8.4–10.5)
Chloride: 105 mEq/L (ref 96–112)
GFR: 77.15 mL/min (ref >60.00)
Glucose, Bld: 93 mg/dL (ref 70–99)
Potassium: 4.6 mEq/L (ref 3.5–5.1)
Sodium: 137 mEq/L (ref 135–145)
TOTAL PROTEIN: 7.1 g/dL (ref 6.0–8.3)

## 2016-09-25 LAB — HIV ANTIBODY (ROUTINE TESTING W REFLEX): HIV: NONREACTIVE

## 2016-09-25 NOTE — Assessment & Plan Note (Signed)
Here for a CPX, doing well RTC one year    

## 2016-09-26 NOTE — Addendum Note (Signed)
Addended by: Kathlene November E on: 09/26/2016 10:13 AM   Modules accepted: Orders

## 2016-10-01 LAB — VITAMIN D 1,25 DIHYDROXY
VITAMIN D3 1, 25 (OH): 21 pg/mL
Vitamin D 1, 25 (OH)2 Total: 21 pg/mL (ref 18–72)

## 2016-10-27 ENCOUNTER — Telehealth: Payer: Self-pay

## 2016-10-27 DIAGNOSIS — Z136 Encounter for screening for cardiovascular disorders: Secondary | ICD-10-CM

## 2016-10-27 DIAGNOSIS — Z8249 Family history of ischemic heart disease and other diseases of the circulatory system: Secondary | ICD-10-CM

## 2016-10-27 NOTE — Telephone Encounter (Signed)
CT heart-cardiac scoring (calcium scorning) ordered. Jenn-can you tell or find out if I ordered this correctly?

## 2016-10-27 NOTE — Telephone Encounter (Signed)
-----   Message from Colon Branch, MD sent at 10/27/2016 10:01 AM EDT ----- Regarding: RE: CT [facility] Space could you   place a order for a CALCIUM CORONARY SCORE Dx: FH CAD, screening CAD JP  ----- Message ----- From: Synthia Innocent Sent: 10/27/2016   7:57 AM To: Colon Branch, MD, Margot Ables Subject: RE: CT                                         I don't see a order. Please advise ----- Message ----- From: Colon Branch, MD Sent: 10/26/2016  12:14 PM To: Colon Branch, MD, Synthia Innocent, # Subject: CT                                             I ordered  a coronary calcium score, was that ever  scheduled?

## 2016-11-03 NOTE — Telephone Encounter (Addendum)
MyChart message sent to Pt regarding denial of coverage, paying OOP (out of pocket) and approximate costs involved, instructed Pt to let us know if she is still interested in coronary calcium scoring. Letter also printed, and mailed to Pt.

## 2016-11-03 NOTE — Telephone Encounter (Signed)
Ebony-can you find out how much a CT heart-calcium score would cost OOP (out of pocket)- procedure code: 75571. Thank you.

## 2016-11-03 NOTE — Telephone Encounter (Signed)
Spoke with the doctor over there, and she said that the procedure was simply NOT covered, and there is NOTHING I can say that WOULD change her coverage.  Advise patient: The procedure is not covered, if she likes to proceed, she can go ahead but will be an out-of-pocket expense.  JP.

## 2016-11-03 NOTE — Telephone Encounter (Signed)
Price inquiry is showing a fee of 255 for the procedure itself. That does not include the others fees for the facility etc.

## 2017-01-20 ENCOUNTER — Encounter: Payer: Self-pay | Admitting: Internal Medicine

## 2017-01-21 NOTE — Telephone Encounter (Signed)
Can you reach out to your [facility] rep to see if coding is correct for this patient's bill?

## 2017-05-29 ENCOUNTER — Ambulatory Visit (INDEPENDENT_AMBULATORY_CARE_PROVIDER_SITE_OTHER): Payer: BLUE CROSS/BLUE SHIELD

## 2017-05-29 DIAGNOSIS — Z23 Encounter for immunization: Secondary | ICD-10-CM

## 2017-05-29 NOTE — Progress Notes (Signed)
Pre visit review using our clinic review tool, if applicable. No additional management support is needed unless otherwise documented below in the visit note.  Pt presented to office for flu vaccination. Administered L deltoid. Pt tolerated injection well.

## 2017-07-06 ENCOUNTER — Telehealth: Payer: Self-pay | Admitting: Internal Medicine

## 2017-07-06 NOTE — Telephone Encounter (Signed)
Copied from [place] [phone]. Topic: Bill or Statement - Patient/Guarantor Inquiry >> Jun 30, 2017 11:01 AM Arletha Grippe wrote: Patient name/MRN/Acct #: Jenna Gardner [id number] - 2071/03/09 DOS: 09/24/2016 Details of issue or inquiry: pt states she was charged for a line item that did not happen.  Pt inquired about service, but it was never done. Cb# [phone]  Route to appropriate [place] or [facility] Coding pool.  >> Jun 30, 2017 11:30 AM Jerene Dilling H wrote: Patient is disputing the charge for 09-24-16. Please review with the provider and contact patient. If it was charged in error please send to charge correction to have corrected. Please let me know if you have any questions. Thank you!

## 2017-07-06 NOTE — Telephone Encounter (Signed)
Bill for DOS has been reviewed, this was charged in error. I have emailed charge correction to have [id number] removed from bill DOS 09/24/16, spoke with patient to inform her and that she should allow 30 days for re-processing. Patient voiced understanding and will call me directly with any other questions

## 2017-07-17 ENCOUNTER — Other Ambulatory Visit (HOSPITAL_BASED_OUTPATIENT_CLINIC_OR_DEPARTMENT_OTHER): Payer: Self-pay | Admitting: Emergency Medicine

## 2017-07-17 DIAGNOSIS — Z1231 Encounter for screening mammogram for malignant neoplasm of breast: Secondary | ICD-10-CM

## 2017-08-11 ENCOUNTER — Ambulatory Visit (HOSPITAL_BASED_OUTPATIENT_CLINIC_OR_DEPARTMENT_OTHER)
Admission: RE | Admit: 2017-08-11 | Discharge: 2017-08-11 | Disposition: A | Discharge status: Home / Self Care | Payer: BLUE CROSS/BLUE SHIELD | Source: Ambulatory Visit | Attending: Emergency Medicine | Admitting: Emergency Medicine

## 2017-08-11 DIAGNOSIS — Z1231 Encounter for screening mammogram for malignant neoplasm of breast: Secondary | ICD-10-CM

## 2017-08-11 LAB — HM MAMMOGRAPHY

## 2017-08-12 ENCOUNTER — Other Ambulatory Visit: Payer: Self-pay | Admitting: Emergency Medicine

## 2017-08-12 DIAGNOSIS — R928 Other abnormal and inconclusive findings on diagnostic imaging of breast: Secondary | ICD-10-CM

## 2017-08-17 ENCOUNTER — Ambulatory Visit
Admission: RE | Admit: 2017-08-17 | Discharge: 2017-08-17 | Disposition: A | Discharge status: Home / Self Care | Payer: BLUE CROSS/BLUE SHIELD | Source: Ambulatory Visit | Attending: Emergency Medicine | Admitting: Emergency Medicine

## 2017-08-17 ENCOUNTER — Other Ambulatory Visit: Payer: Self-pay | Admitting: Emergency Medicine

## 2017-08-17 DIAGNOSIS — R928 Other abnormal and inconclusive findings on diagnostic imaging of breast: Secondary | ICD-10-CM

## 2017-08-17 DIAGNOSIS — N6489 Other specified disorders of breast: Secondary | ICD-10-CM

## 2017-08-26 DIAGNOSIS — Z01419 Encounter for gynecological examination (general) (routine) without abnormal findings: Secondary | ICD-10-CM | POA: Diagnosis not present

## 2017-08-26 DIAGNOSIS — N952 Postmenopausal atrophic vaginitis: Secondary | ICD-10-CM | POA: Diagnosis not present

## 2017-08-26 DIAGNOSIS — Z3202 Encounter for pregnancy test, result negative: Secondary | ICD-10-CM | POA: Diagnosis not present

## 2017-08-26 DIAGNOSIS — N920 Excessive and frequent menstruation with regular cycle: Secondary | ICD-10-CM | POA: Diagnosis not present

## 2017-08-26 DIAGNOSIS — Z1151 Encounter for screening for human papillomavirus (HPV): Secondary | ICD-10-CM | POA: Diagnosis not present

## 2017-08-26 LAB — HM PAP SMEAR

## 2017-09-25 ENCOUNTER — Encounter: Payer: Self-pay | Admitting: Internal Medicine

## 2017-09-28 ENCOUNTER — Ambulatory Visit (INDEPENDENT_AMBULATORY_CARE_PROVIDER_SITE_OTHER): Payer: BLUE CROSS/BLUE SHIELD | Admitting: Internal Medicine

## 2017-09-28 ENCOUNTER — Encounter: Payer: Self-pay | Admitting: Internal Medicine

## 2017-09-28 VITALS — BP 124/68 | HR 77 | Temp 97.6°F | Resp 14 | Ht 66.0 in | Wt 221.0 lb

## 2017-09-28 DIAGNOSIS — Z23 Encounter for immunization: Secondary | ICD-10-CM | POA: Diagnosis not present

## 2017-09-28 DIAGNOSIS — Z0000 Encounter for general adult medical examination without abnormal findings: Secondary | ICD-10-CM | POA: Diagnosis not present

## 2017-09-28 LAB — CBC WITH DIFFERENTIAL/PLATELET
BASOS PCT: 1 % (ref 0.0–3.0)
Basophils Absolute: 0.1 10*3/uL (ref 0.0–0.1)
EOS ABS: 0.3 10*3/uL (ref 0.0–0.7)
Eosinophils Relative: 4 % (ref 0.0–5.0)
HCT: 36 % (ref 36.0–46.0)
Hemoglobin: 11.9 g/dL — ABNORMAL LOW (ref 12.0–15.0)
LYMPHS ABS: 2 10*3/uL (ref 0.7–4.0)
Lymphocytes Relative: 31.4 % (ref 12.0–46.0)
MCHC: 33.2 g/dL (ref 30.0–36.0)
MCV: 89.1 fl (ref 78.0–100.0)
MONO ABS: 0.4 10*3/uL (ref 0.1–1.0)
Monocytes Relative: 6.9 % (ref 3.0–12.0)
NEUTROS ABS: 3.5 10*3/uL (ref 1.4–7.7)
NEUTROS PCT: 56.7 % (ref 43.0–77.0)
PLATELETS: 344 10*3/uL (ref 150.0–400.0)
RBC: 4.04 Mil/uL (ref 3.87–5.11)
RDW: 13.3 % (ref 11.5–15.5)
WBC: 6.3 10*3/uL (ref 4.0–10.5)

## 2017-09-28 LAB — COMPREHENSIVE METABOLIC PANEL
ALK PHOS: 53 U/L (ref 39–117)
ALT: 15 U/L (ref 0–35)
AST: 14 U/L (ref 0–37)
Albumin: 3.9 g/dL (ref 3.5–5.2)
BUN: 12 mg/dL (ref 6–23)
CALCIUM: 9.3 mg/dL (ref 8.4–10.5)
CO2: 25 meq/L (ref 19–32)
Chloride: 105 mEq/L (ref 96–112)
Creatinine, Ser: 0.83 mg/dL (ref 0.40–1.20)
GFR: 77.89 mL/min (ref >60.00)
GLUCOSE: 89 mg/dL (ref 70–99)
POTASSIUM: 5.1 meq/L (ref 3.5–5.1)
SODIUM: 138 meq/L (ref 135–145)
Total Bilirubin: 0.4 mg/dL (ref 0.2–1.2)
Total Protein: 6.8 g/dL (ref 6.0–8.3)

## 2017-09-28 LAB — LIPID PANEL
Cholesterol: 192 mg/dL (ref 0–200)
HDL: 65.7 mg/dL (ref >39.00)
LDL CALC: 112 mg/dL — AB (ref 0–99)
NonHDL: 125.89
TRIGLYCERIDES: 68 mg/dL (ref 0.0–149.0)
Total CHOL/HDL Ratio: 3
VLDL: 13.6 mg/dL (ref 0.0–40.0)

## 2017-09-28 LAB — VITAMIN B12: VITAMIN B 12: 353 pg/mL (ref 211–911)

## 2017-09-28 NOTE — Assessment & Plan Note (Signed)
Here for a CPX, doing well RTC one year

## 2017-09-28 NOTE — Patient Instructions (Signed)
GO TO THE LAB : Get the blood work     GO TO THE FRONT DESK Schedule your next appointment for a physical exam in 1 year  Take OTC vitamin D 1000 units daily Take OTC B12 supplements daily Please let me know if you like to proceed with a MMR immunization (measles)

## 2017-09-28 NOTE — Progress Notes (Signed)
Subjective:    Patient ID: Jenna Gardner, female    DOB: May 29, 1970, 47 y.o.   MRN: [id number]  DOS:  09/28/2017 Type of visit - description : cpx Interval history: no concerns   Wt Readings from Last 3 Encounters:  09/28/17 221 lb (100.2 kg)  09/24/16 225 lb 4 oz (102.2 kg)  11/05/15 227 lb 8 oz (103.2 kg)    Review of Systems In general doing well, has lost some weight, eating a little healthier.  Physical activity unchanged Denies chest pain no difficulty breathing  Other than above, a 14 point review of systems is negative      Past Medical History:  Diagnosis Date  . Contraception    essure  . Thyromegaly     Past Surgical History:  Procedure Laterality Date  . CESAREAN SECTION     1996  . g3 p3    . TUBAL LIGATION     essure     Social History   Socioeconomic History  . Marital status: Married    Spouse name: Christy Sartorius  . Number of children: 3  . Years of education: Not on file  . Highest education level: Not on file  Occupational History  . Occupation: Engineer, maintenance (IT): [employer]  Social Needs  . Financial resource strain: Not on file  . Food insecurity:    Worry: Not on file    Inability: Not on file  . Transportation needs:    Medical: Not on file    Non-medical: Not on file  Tobacco Use  . Smoking status: Never Smoker  . Smokeless tobacco: Never Used  Substance and Sexual Activity  . Alcohol use: Yes    Comment: OCCASIONALLY  . Drug use: No  . Sexual activity: Yes    Birth control/protection: None  Lifestyle  . Physical activity:    Days per week: Not on file    Minutes per session: Not on file  . Stress: Not on file  Relationships  . Social connections:    Talks on phone: Not on file    Gets together: Not on file    Attends religious service: Not on file    Active member of club or organization: Not on file    Attends meetings of clubs or organizations: Not on file    Relationship  status: Not on file  . Intimate partner violence:    Fear of current or ex partner: Not on file    Emotionally abused: Not on file    Physically abused: Not on file    Forced sexual activity: Not on file  Other Topics Concern  . Not on file  Social History Narrative   Household:  pt, husband, 1 child, pt's mother      Family History  Problem Relation Age of Onset  . Heart disease Mother 40        MI age 57   . Uterine cancer Mother   . Breast cancer Paternal Aunt        aunt, 2 cousins; early dx (88s)  . Diabetes Father   . Heart attack Father 62       first MI age 5  . Hypertension Neg Hx   . Stroke Neg Hx   . Colon cancer Neg Hx      Allergies as of 09/28/2017      Reactions   Bactrim Rash, Other (See Comments)  Question af allergic reaction, see OV 07-15-2011    Penicillins Hives, Other (See Comments)   Childhood allergy   Sulfamethoxazole-trimethoprim Rash   Question af allergic reaction, see OV 07-15-2011  Question af allergic reaction, see OV 07-15-2011       Medication List    as of 09/28/2017  7:17 PM   You have not been prescribed any medications.        Objective:   Physical Exam BP 124/68 (BP Location: Left Arm, Patient Position: Sitting, Cuff Size: Normal)   Pulse 77   Temp 97.6 F (36.4 C) (Oral)   Resp 14   Ht 5\' 6"  (1.676 m)   Wt 221 lb (100.2 kg)   SpO2 98%   BMI 35.67 kg/m  General:   Well developed, well nourished . NAD.  Neck: No  thyromegaly  HEENT:  Normocephalic . Face symmetric, atraumatic Lungs:  CTA B Normal respiratory effort, no intercostal retractions, no accessory muscle use. Heart: RRR,  no murmur.  No pretibial edema bilaterally  Abdomen:  Not distended, soft, non-tender. No rebound or rigidity.   Skin: Exposed areas without rash. Not pale. Not jaundice Neurologic:  alert & oriented X3.  Speech normal, gait appropriate for age and unassisted Strength symmetric and appropriate for age.  Psych: Cognition and  judgment appear intact.  Cooperative with normal attention span and concentration.  Behavior appropriate. No anxious or depressed appearing.     Assessment & Plan:    Assessment  H/o Tubal ligation Essure H/o thyromegaly - nl [place] 2014 FH CAD, admitted with chest pain 01-2014, stress test negative   PLAN: Here for a CPX, doing well RTC one year

## 2017-09-28 NOTE — Assessment & Plan Note (Addendum)
--  Td 09/2017.  Concerned about measles, she had 1 dose of MMR in her childhood, per guidelines okay to provide a MMR booster.  Patient will talk with her insurance before we proceed. --Female care: Had a Pap smear with gynecology  08/2017 -FH breast cancer (aunt, 2 cousins): last mmg 07-2017, to have repeat mmg in 6 months   --CCS: never had a cscope  --Diet-exercise discussed --+ FH CAD, negative stress test 2015, see last visit, coronary calcium score never done ($$).  Patient has no symptoms, plan is to control CV RF although she would be reluctant to take cholesterol medication. --Labs: CMP, FLP, CBC, B12.  Declined with vitamin D check due to cost

## 2017-09-28 NOTE — Progress Notes (Signed)
Pre visit review using our clinic review tool, if applicable. No additional management support is needed unless otherwise documented below in the visit note. 

## 2017-10-22 ENCOUNTER — Encounter: Payer: Self-pay | Admitting: Family Medicine

## 2017-10-22 ENCOUNTER — Ambulatory Visit: Payer: BLUE CROSS/BLUE SHIELD | Admitting: Family Medicine

## 2017-10-22 VITALS — BP 130/62 | HR 106 | Temp 98.2°F | Resp 16 | Wt 220.2 lb

## 2017-10-22 DIAGNOSIS — H6501 Acute serous otitis media, right ear: Secondary | ICD-10-CM | POA: Diagnosis not present

## 2017-10-22 MED ORDER — FLUTICASONE PROPIONATE 50 MCG/ACT NA SUSP: 2.0000 | Freq: Every day | NASAL | 6 refills | Status: DC

## 2017-10-22 NOTE — Progress Notes (Signed)
Subjective:  I acted as a Education administrator for [employer]. Yancey Flemings, [place]   Patient ID: Jenna Gardner, female    DOB: 04-Apr-1971, 47 y.o.   MRN: [id number]  Chief Complaint  Patient presents with  . Otalgia    right ear on going for 3 weeks    HPI  Patient is in today for right ear pain on and off for about 2-3 weeks.  No other complaints.    Patient Care Team: Colon Branch, MD as PCP - General (Internal Medicine)   Past Medical History:  Diagnosis Date  . Contraception    essure  . Thyromegaly     Past Surgical History:  Procedure Laterality Date  . CESAREAN SECTION     1996  . g3 p3    . TUBAL LIGATION     essure     Family History  Problem Relation Age of Onset  . Heart disease Mother 81        MI age 79   . Uterine cancer Mother   . Breast cancer Paternal Aunt        aunt, 2 cousins; early dx (31s)  . Diabetes Father   . Heart attack Father 27       first MI age 23  . Hypertension Neg Hx   . Stroke Neg Hx   . Colon cancer Neg Hx     Social History   Socioeconomic History  . Marital status: Married    Spouse name: Christy Sartorius  . Number of children: 3  . Years of education: Not on file  . Highest education level: Not on file  Occupational History  . 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 Financial resource strain: Not on file  . Food insecurity:    Worry: Not on file    Inability: Not on file  . Transportation needs:    Medical: Not on file    Non-medical: Not on file  Tobacco Use  . Smoking status: Never Smoker  . Smokeless tobacco: Never Used  Substance and Sexual Activity  . Alcohol use: Yes    Comment: OCCASIONALLY  . Drug use: No  . Sexual activity: Yes    Birth control/protection: None  Lifestyle  . Physical activity:    Days per week: Not on file    Minutes per session: Not on file  . Stress: Not on file  Relationships  . Social connections:    Talks on phone: Not on  file    Gets together: Not on file    Attends religious service: Not on file    Active member of club or organization: Not on file    Attends meetings of clubs or organizations: Not on file    Relationship status: Not on file  . Intimate partner violence:    Fear of current or ex partner: Not on file    Emotionally abused: Not on file    Physically abused: Not on file    Forced sexual activity: Not on file  Other Topics Concern  . Not on file  Social History Narrative   Household:  pt, husband, 1 child, pt's mother     No outpatient medications prior to visit.   No facility-administered medications prior to visit.     Allergies  Allergen Reactions  . Bactrim Rash and Other (See Comments)    Question af allergic reaction, see OV  07-15-2011   . Penicillins Hives and Other (See Comments)    Childhood allergy  . Sulfamethoxazole-Trimethoprim Rash    Question af allergic reaction, see OV 07-15-2011  Question af allergic reaction, see OV 07-15-2011     Review of Systems  Constitutional: Negative for chills, fever and malaise/fatigue.  HENT: Positive for ear pain. Negative for congestion, hearing loss and sinus pain.   Eyes: Negative for discharge.  Respiratory: Negative for cough, sputum production and shortness of breath.   Cardiovascular: Negative for chest pain, palpitations and leg swelling.  Gastrointestinal: Negative for abdominal pain, blood in stool, constipation, diarrhea, heartburn, nausea and vomiting.  Genitourinary: Negative for dysuria, frequency, hematuria and urgency.  Musculoskeletal: Negative for back pain, falls and myalgias.  Skin: Negative for rash.  Neurological: Negative for dizziness, sensory change, loss of consciousness, weakness and headaches.  Endo/Heme/Allergies: Negative for environmental allergies. Does not bruise/bleed easily.  Psychiatric/Behavioral: Negative for depression and suicidal ideas. The patient is not nervous/anxious and does not have  insomnia.        Objective:    Physical Exam  Constitutional: She is oriented to person, place, and time. She appears well-developed and well-nourished.  HENT:  Head: Normocephalic and atraumatic.  Right Ear: No lacerations. There is tenderness. No foreign bodies. No mastoid tenderness. Tympanic membrane is bulging. Tympanic membrane is not injected, not scarred, not perforated, not erythematous and not retracted. A middle ear effusion is present.  Ears:  Eyes: Conjunctivae and EOM are normal.  Neck: Normal range of motion. Neck supple. No JVD present. Carotid bruit is not present. No thyromegaly present.  Cardiovascular: Normal rate, regular rhythm and normal heart sounds.  No murmur heard. Pulmonary/Chest: Effort normal and breath sounds normal. No respiratory distress. She has no wheezes. She has no rales. She exhibits no tenderness.  Musculoskeletal: She exhibits no edema.  Neurological: She is alert and oriented to person, place, and time.  Psychiatric: She has a normal mood and affect.  Nursing note and vitals reviewed.   BP 130/62 (BP Location: Left Arm, Patient Position: Sitting, Cuff Size: Large)   Pulse (!) 106   Temp 98.2 F (36.8 C) (Oral)   Resp 16   Wt 220 lb 3.2 oz (99.9 kg)   SpO2 97%   BMI 35.54 kg/m  Wt Readings from Last 3 Encounters:  10/22/17 220 lb 3.2 oz (99.9 kg)  09/28/17 221 lb (100.2 kg)  09/24/16 225 lb 4 oz (102.2 kg)   BP Readings from Last 3 Encounters:  10/22/17 130/62  09/28/17 124/68  09/24/16 108/70     Immunization History  Administered Date(s) Administered  . Influenza,inj,Quad PF,6+ Mos 05/29/2017  . Td 06/11/2007  . Tdap 09/28/2017    Health Maintenance  Topic Date Due  . INFLUENZA VACCINE  12/24/2017  . MAMMOGRAM  08/12/2018  . PAP SMEAR  08/26/2020  . TETANUS/TDAP  09/29/2027  . HIV Screening  Completed    Lab Results  Component Value Date   WBC 6.3 09/28/2017   HGB 11.9 (L) 09/28/2017   HCT 36.0 09/28/2017   PLT  344.0 09/28/2017   GLUCOSE 89 09/28/2017   CHOL 192 09/28/2017   TRIG 68.0 09/28/2017   HDL 65.70 09/28/2017   LDLDIRECT 122.4 06/09/2012   LDLCALC 112 (H) 09/28/2017   ALT 15 09/28/2017   AST 14 09/28/2017   NA 138 09/28/2017   K 5.1 09/28/2017   CL 105 09/28/2017   CREATININE 0.83 09/28/2017   BUN 12 09/28/2017   CO2  25 09/28/2017   TSH 0.57 09/25/2016   HGBA1C 5.2 02/16/2015    Lab Results  Component Value Date   TSH 0.57 09/25/2016   Lab Results  Component Value Date   WBC 6.3 09/28/2017   HGB 11.9 (L) 09/28/2017   HCT 36.0 09/28/2017   MCV 89.1 09/28/2017   PLT 344.0 09/28/2017   Lab Results  Component Value Date   NA 138 09/28/2017   K 5.1 09/28/2017   CO2 25 09/28/2017   GLUCOSE 89 09/28/2017   BUN 12 09/28/2017   CREATININE 0.83 09/28/2017   BILITOT 0.4 09/28/2017   ALKPHOS 53 09/28/2017   AST 14 09/28/2017   ALT 15 09/28/2017   PROT 6.8 09/28/2017   ALBUMIN 3.9 09/28/2017   CALCIUM 9.3 09/28/2017   ANIONGAP 13 01/29/2014   GFR 77.89 09/28/2017   Lab Results  Component Value Date   CHOL 192 09/28/2017   Lab Results  Component Value Date   HDL 65.70 09/28/2017   Lab Results  Component Value Date   LDLCALC 112 (H) 09/28/2017   Lab Results  Component Value Date   TRIG 68.0 09/28/2017   Lab Results  Component Value Date   CHOLHDL 3 09/28/2017   Lab Results  Component Value Date   HGBA1C 5.2 02/16/2015         Assessment & Plan:   Problem List Items Addressed This Visit    None    Visit Diagnoses    Right acute serous otitis media, recurrence not specified    -  Primary   Relevant Medications   fluticasone (FLONASE) 50 MCG/ACT nasal spray         Restart zyrtec Consider ENT if pain does not improve  I am having Jenna Gardner start on fluticasone.  Meds ordered this encounter  Medications  . fluticasone (FLONASE) 50 MCG/ACT nasal spray    Sig: Place 2 sprays into both nostrils daily.    Dispense:  16 g    Refill:   6    CMA served as scribe during this visit. History, Physical and Plan performed by medical provider. Documentation and orders reviewed and attested to.  Ann Held, DO

## 2017-10-22 NOTE — Patient Instructions (Signed)
Otitis Media, Adult Otitis media is redness, soreness, and puffiness (swelling) in the space just behind your eardrum (middle ear). It may be caused by allergies or infection. It often happens along with a cold. Follow these instructions at home:  Take your medicine as told. Finish it even if you start to feel better.  Only take over-the-counter or prescription medicines for pain, discomfort, or fever as told by your doctor.  Follow up with your doctor as told. Contact a doctor if:  You have otitis media only in one ear, or bleeding from your nose, or both.  You notice a lump on your neck.  You are not getting better in 3-5 days.  You feel worse instead of better. Get help right away if:  You have pain that is not helped with medicine.  You have puffiness, redness, or pain around your ear.  You get a stiff neck.  You cannot move part of your face (paralysis).  You notice that the bone behind your ear hurts when you touch it. This information is not intended to replace advice given to you by your health care provider. Make sure you discuss any questions you have with your health care provider. Document Released: 10/29/2007 Document Revised: 10/18/2015 Document Reviewed: 12/07/2012 Elsevier Interactive Patient Education  2017 [employer]  

## 2017-12-20 ENCOUNTER — Emergency Department (HOSPITAL_BASED_OUTPATIENT_CLINIC_OR_DEPARTMENT_OTHER): Payer: BLUE CROSS/BLUE SHIELD

## 2017-12-20 ENCOUNTER — Other Ambulatory Visit: Payer: Self-pay

## 2017-12-20 ENCOUNTER — Emergency Department (HOSPITAL_BASED_OUTPATIENT_CLINIC_OR_DEPARTMENT_OTHER)
Admission: EM | Admit: 2017-12-20 | Discharge: 2017-12-20 | Disposition: A | Discharge status: Home / Self Care | Payer: BLUE CROSS/BLUE SHIELD | Attending: Emergency Medicine | Admitting: Emergency Medicine

## 2017-12-20 ENCOUNTER — Encounter (HOSPITAL_BASED_OUTPATIENT_CLINIC_OR_DEPARTMENT_OTHER): Payer: Self-pay | Admitting: Emergency Medicine

## 2017-12-20 DIAGNOSIS — D259 Leiomyoma of uterus, unspecified: Secondary | ICD-10-CM

## 2017-12-20 DIAGNOSIS — R1084 Generalized abdominal pain: Secondary | ICD-10-CM | POA: Diagnosis not present

## 2017-12-20 DIAGNOSIS — R112 Nausea with vomiting, unspecified: Secondary | ICD-10-CM | POA: Diagnosis not present

## 2017-12-20 DIAGNOSIS — R109 Unspecified abdominal pain: Secondary | ICD-10-CM | POA: Diagnosis not present

## 2017-12-20 DIAGNOSIS — R1110 Vomiting, unspecified: Secondary | ICD-10-CM | POA: Diagnosis not present

## 2017-12-20 DIAGNOSIS — R1030 Lower abdominal pain, unspecified: Secondary | ICD-10-CM | POA: Diagnosis not present

## 2017-12-20 LAB — COMPREHENSIVE METABOLIC PANEL
ALBUMIN: 3.7 g/dL (ref 3.5–5.0)
ALT: 17 U/L (ref 0–44)
ANION GAP: 9 (ref 5–15)
AST: 23 U/L (ref 15–41)
Alkaline Phosphatase: 68 U/L (ref 38–126)
BILIRUBIN TOTAL: 0.3 mg/dL (ref 0.3–1.2)
BUN: 13 mg/dL (ref 6–20)
CO2: 24 mmol/L (ref 22–32)
Calcium: 9.2 mg/dL (ref 8.9–10.3)
Chloride: 103 mmol/L (ref 98–111)
Creatinine, Ser: 0.84 mg/dL (ref 0.44–1.00)
GFR calc Af Amer: >60 mL/min (ref >60)
GFR calc non Af Amer: >60 mL/min (ref >60)
Glucose, Bld: 135 mg/dL — ABNORMAL HIGH (ref 70–99)
POTASSIUM: 4.4 mmol/L (ref 3.5–5.1)
SODIUM: 136 mmol/L (ref 135–145)
Total Protein: 7.1 g/dL (ref 6.5–8.1)

## 2017-12-20 LAB — CBC
HEMATOCRIT: 35.2 % — AB (ref 36.0–46.0)
HEMOGLOBIN: 11.6 g/dL — AB (ref 12.0–15.0)
MCH: 28.6 pg (ref 26.0–34.0)
MCHC: 33 g/dL (ref 30.0–36.0)
MCV: 86.9 fL (ref 78.0–100.0)
Platelets: 321 10*3/uL (ref 150–400)
RBC: 4.05 MIL/uL (ref 3.87–5.11)
RDW: 13.8 % (ref 11.5–15.5)
WBC: 11 10*3/uL — ABNORMAL HIGH (ref 4.0–10.5)

## 2017-12-20 LAB — WET PREP, GENITAL
Clue Cells Wet Prep HPF POC: NONE SEEN
SPERM: NONE SEEN
TRICH WET PREP: NONE SEEN
WBC WET PREP: NONE SEEN
YEAST WET PREP: NONE SEEN

## 2017-12-20 LAB — URINALYSIS, MICROSCOPIC (REFLEX)

## 2017-12-20 LAB — URINALYSIS, ROUTINE W REFLEX MICROSCOPIC
Bilirubin Urine: NEGATIVE
Glucose, UA: NEGATIVE mg/dL
Ketones, ur: NEGATIVE mg/dL
LEUKOCYTES UA: NEGATIVE
Nitrite: NEGATIVE
Protein, ur: NEGATIVE mg/dL
Specific Gravity, Urine: >1.03 — ABNORMAL HIGH (ref 1.005–1.030)
pH: 6 (ref 5.0–8.0)

## 2017-12-20 LAB — LIPASE, BLOOD: Lipase: 40 U/L (ref 11–51)

## 2017-12-20 LAB — PREGNANCY, URINE: PREG TEST UR: NEGATIVE

## 2017-12-20 MED ORDER — ONDANSETRON HCL 4 MG PO TABS: 4.0000 mg | ORAL_TABLET | Freq: Three times a day (TID) | ORAL | 0 refills | Status: DC | PRN

## 2017-12-20 MED ORDER — ONDANSETRON HCL 4 MG/2ML IJ SOLN
4.0000 mg | Freq: Once | INTRAMUSCULAR | Status: AC | PRN
  Administered 2017-12-20: 4 mg via INTRAVENOUS
  Filled 2017-12-20: qty 2

## 2017-12-20 MED ORDER — OXYCODONE-ACETAMINOPHEN 5-325 MG PO TABS: 1.0000 | ORAL_TABLET | ORAL | 0 refills | Status: DC | PRN

## 2017-12-20 MED ORDER — MORPHINE SULFATE (PF) 4 MG/ML IV SOLN
4.0000 mg | Freq: Once | INTRAVENOUS | Status: AC
  Administered 2017-12-20: 4 mg via INTRAVENOUS
  Filled 2017-12-20: qty 1

## 2017-12-20 MED ORDER — ONDANSETRON HCL 4 MG/2ML IJ SOLN
4.0000 mg | Freq: Once | INTRAMUSCULAR | Status: AC
  Administered 2017-12-20: 4 mg via INTRAVENOUS
  Filled 2017-12-20: qty 2

## 2017-12-20 MED ORDER — IOPAMIDOL (ISOVUE-300) INJECTION 61%
100.0000 mL | Freq: Once | INTRAVENOUS | Status: AC | PRN
  Administered 2017-12-20: 100 mL via INTRAVENOUS

## 2017-12-20 MED ORDER — SODIUM CHLORIDE 0.9 % IV BOLUS
1000.0000 mL | Freq: Once | INTRAVENOUS | Status: AC
  Administered 2017-12-20: 1000 mL via INTRAVENOUS

## 2017-12-20 NOTE — ED Notes (Signed)
Unable to get patient for torsion doppler as ED physician wants to perform pelvic exam first.

## 2017-12-20 NOTE — ED Notes (Signed)
Pt is pale. She states she is on her period and it is heavy.

## 2017-12-20 NOTE — ED Notes (Signed)
Patient transported to Ultrasound 

## 2017-12-20 NOTE — Discharge Instructions (Signed)
Your work-up today showed evidence of uterine fibroid likely causing her abdominal pain.  Your appendix was normal and we did not see evidence of kidney stones.  Please follow-up with your OB/GYN for further management.  Please stay hydrated and use the pain and nausea medicine as needed.  If any symptoms change or worsen, please return to the nearest emergency department.

## 2017-12-20 NOTE — ED Provider Notes (Signed)
[facility] DEPARTMENT Provider Note   CSN: [id number] Arrival date & time: 12/20/17  0911     History   Chief Complaint Chief Complaint  Patient presents with  . Abdominal Pain    HPI Jenna Gardner is a 47 y.o. female.  The history is provided by the patient, medical records and the spouse.  Abdominal Pain   This is a new problem. The current episode started 12 to 24 hours ago. The problem occurs constantly. The problem has not changed since onset.The pain is associated with an unknown factor. The pain is located in the suprapubic region. The quality of the pain is aching, dull and cramping. The pain is moderate. Associated symptoms include diarrhea, nausea, vomiting and constipation. Pertinent negatives include fever, dysuria and frequency. The symptoms are aggravated by palpation. Nothing relieves the symptoms.    Past Medical History:  Diagnosis Date  . Contraception    essure  . Thyromegaly     Patient Active Problem List   Diagnosis Date Noted  . PCP NOTES >>>>>>>>>>>>>>> 02/17/2015  . Anxiety 06/09/2012  . RUQ pain 05/05/2012  . Annual physical exam 04/02/2011  . Goiter 01/11/2010    Past Surgical History:  Procedure Laterality Date  . CESAREAN SECTION     1996  . g3 p3    . TUBAL LIGATION     essure      OB History   None      Home Medications    Prior to Admission medications   Medication Sig Start Date End Date Taking? Authorizing Provider  fluticasone (FLONASE) 50 MCG/ACT nasal spray Place 2 sprays into both nostrils daily. 10/22/17   Ann Held, DO    Family History Family History  Problem Relation Age of Onset  . Heart disease Mother 70        MI age 52   . Uterine cancer Mother   . Breast cancer Paternal Aunt        aunt, 2 cousins; early dx (38s)  . Diabetes Father   . Heart attack Father 58       first MI age 58  . Hypertension Neg Hx   . Stroke Neg Hx   . Colon cancer Neg Hx     Social  History Social History   Tobacco Use  . Smoking status: Never Smoker  . Smokeless tobacco: Never Used  Substance Use Topics  . Alcohol use: Yes    Comment: daily  . Drug use: No     Allergies   Bactrim; Penicillins; and Sulfamethoxazole-trimethoprim   Review of Systems Review of Systems  Constitutional: Negative for chills, diaphoresis, fatigue and fever.  HENT: Negative for congestion.   Respiratory: Negative for cough, chest tightness, shortness of breath and wheezing.   Cardiovascular: Negative for chest pain and palpitations.  Gastrointestinal: Positive for abdominal pain, constipation, diarrhea, nausea and vomiting.  Genitourinary: Positive for pelvic pain and vaginal bleeding. Negative for dysuria, flank pain, frequency, vaginal discharge and vaginal pain.  Musculoskeletal: Negative for back pain, neck pain and neck stiffness.  Skin: Negative for rash and wound.  Psychiatric/Behavioral: Negative for agitation and confusion.  All other systems reviewed and are negative.    Physical Exam Updated Vital Signs BP 110/65 (BP Location: Left Arm)   Pulse 63   Temp 98.4 F (36.9 C) (Oral)   Resp 18   Ht 5\' 7"  (1.702 m)   Wt 99.8 kg (220 lb)   LMP 12/18/2017  SpO2 100%   BMI 34.46 kg/m   Physical Exam  Constitutional: She is oriented to person, place, and time. She appears well-developed and well-nourished.  Non-toxic appearance. She does not appear ill. No distress.  HENT:  Head: Normocephalic and atraumatic.  Right Ear: External ear normal.  Left Ear: External ear normal.  Nose: Nose normal.  Mouth/Throat: Oropharynx is clear and moist. No oropharyngeal exudate.  Eyes: Pupils are equal, round, and reactive to light. Conjunctivae and EOM are normal.  Neck: Normal range of motion. Neck supple.  Cardiovascular: Normal rate and intact distal pulses.  No murmur heard. Pulmonary/Chest: Effort normal. No stridor. No respiratory distress. She has no wheezes. She has  no rales. She exhibits no tenderness.  Abdominal: Soft. Normal appearance and bowel sounds are normal. She exhibits no distension and no mass. There is tenderness in the suprapubic area. There is no rigidity, no rebound, no guarding and no CVA tenderness.  Genitourinary: Cervix exhibits no motion tenderness, no discharge and no friability. Right adnexum displays no tenderness. Left adnexum displays no tenderness. There is bleeding in the vagina. No erythema or tenderness in the vagina. No vaginal discharge found.  Musculoskeletal: She exhibits no edema or tenderness.  Neurological: She is alert and oriented to person, place, and time. She has normal reflexes. No sensory deficit. She exhibits normal muscle tone. Coordination normal.  Skin: Skin is warm. Capillary refill takes less than 2 seconds. No rash noted. She is not diaphoretic. No erythema.  Psychiatric: She has a normal mood and affect.  Nursing note and vitals reviewed.    ED Treatments / Results  Labs (all labs ordered are listed, but only abnormal results are displayed) Labs Reviewed  URINALYSIS, ROUTINE W REFLEX MICROSCOPIC - Abnormal; Notable for the following components:      Result Value   Specific Gravity, Urine >1.030 (*)    Hgb urine dipstick LARGE (*)    All other components within normal limits  COMPREHENSIVE METABOLIC PANEL - Abnormal; Notable for the following components:   Glucose, Bld 135 (*)    All other components within normal limits  CBC - Abnormal; Notable for the following components:   WBC 11.0 (*)    Hemoglobin 11.6 (*)    HCT 35.2 (*)    All other components within normal limits  URINALYSIS, MICROSCOPIC (REFLEX) - Abnormal; Notable for the following components:   Bacteria, UA MANY (*)    All other components within normal limits  WET PREP, GENITAL  PREGNANCY, URINE  LIPASE, BLOOD  GC/CHLAMYDIA PROBE AMP ([place]) NOT AT [facility]  WET PREP  (BD AFFIRM) ([place])    EKG None  Radiology US  Transvaginal Non-ob  Result Date: 12/20/2017 CLINICAL DATA:  Right lower quadrant abdominal pain. Abnormal CT scan. EXAM: TRANSABDOMINAL AND TRANSVAGINAL ULTRASOUND OF PELVIS TECHNIQUE: Both transabdominal and transvaginal ultrasound examinations of the pelvis were performed. Transabdominal technique was performed for global imaging of the pelvis including uterus, ovaries, adnexal regions, and pelvic cul-de-sac. It was necessary to proceed with endovaginal exam following the transabdominal exam to visualize the endometrium. COMPARISON:  CT of the abdomen and pelvis 12/20/2017. FINDINGS: Uterus Measurements: Is heterogeneous. Two hypoechoic lesions are present within the fundus compatible with uterine fibroids. The larger is on the left measuring 4.4 x 4.3 x 3.7 cm. A smaller anterior lesion measures 1.5 x 1.3 x 1.5 cm. Overall size is within normal limits measuring 11.2 x 6.7 x 8.1 cm. No fibroids or other mass visualized. Endometrium Thickness:  3 mm, within normal limits. No focal abnormality visualized. Right ovary Measurements: 2.8 x 2.3 x 1.7 cm, within normal limits. Normal appearance/no adnexal mass. Left ovary Measurements: 3.2 x 2.1 x 2.2 cm, within normal limits. Normal appearance/no adnexal mass. Other findings No abnormal free fluid. IMPRESSION: 1. Ultrasound confirms uterine fibroid, compatible with the abnormal CT scan. 2. No acute abnormality. Electronically Signed   By: San Morelle M.D.   On: 12/20/2017 13:15   US Pelvis Complete  Result Date: 12/20/2017 CLINICAL DATA:  Right lower quadrant abdominal pain. Abnormal CT scan. EXAM: TRANSABDOMINAL AND TRANSVAGINAL ULTRASOUND OF PELVIS TECHNIQUE: Both transabdominal and transvaginal ultrasound examinations of the pelvis were performed. Transabdominal technique was performed for global imaging of the pelvis including uterus, ovaries, adnexal regions, and pelvic cul-de-sac. It was necessary to proceed with endovaginal exam following the  transabdominal exam to visualize the endometrium. COMPARISON:  CT of the abdomen and pelvis 12/20/2017. FINDINGS: Uterus Measurements: Is heterogeneous. Two hypoechoic lesions are present within the fundus compatible with uterine fibroids. The larger is on the left measuring 4.4 x 4.3 x 3.7 cm. A smaller anterior lesion measures 1.5 x 1.3 x 1.5 cm. Overall size is within normal limits measuring 11.2 x 6.7 x 8.1 cm. No fibroids or other mass visualized. Endometrium Thickness: 3 mm, within normal limits. No focal abnormality visualized. Right ovary Measurements: 2.8 x 2.3 x 1.7 cm, within normal limits. Normal appearance/no adnexal mass. Left ovary Measurements: 3.2 x 2.1 x 2.2 cm, within normal limits. Normal appearance/no adnexal mass. Other findings No abnormal free fluid. IMPRESSION: 1. Ultrasound confirms uterine fibroid, compatible with the abnormal CT scan. 2. No acute abnormality. Electronically Signed   By: San Morelle M.D.   On: 12/20/2017 13:15   Ct Abdomen Pelvis W Contrast  Result Date: 12/20/2017 CLINICAL DATA:  Acute onset of right lower quadrant abdominal pain this morning with nausea and vomiting. EXAM: CT ABDOMEN AND PELVIS WITH CONTRAST TECHNIQUE: Multidetector CT imaging of the abdomen and pelvis was performed using the standard protocol following bolus administration of intravenous contrast. CONTRAST:  143mL ISOVUE-300 IOPAMIDOL (ISOVUE-300) INJECTION 61% COMPARISON:  None. FINDINGS: Lower chest: The lung bases are clear. The heart is normal in size. No pericardial effusion. There is a small hiatal hernia noted. Hepatobiliary: No focal hepatic lesions or intrahepatic biliary dilatation. The gallbladder is normal. No common bile duct dilatation. Pancreas: No mass, inflammation or ductal dilatation. Spleen: Normal size.  No focal lesions. Adrenals/Urinary Tract: The adrenal glands and kidneys are unremarkable. No renal, ureteral or bladder calculi or mass. Stomach/Bowel: The stomach,  duodenum, small bowel and colon are grossly normal without oral contrast. No inflammatory changes, mass lesions or obstructive findings. The terminal ileum and appendix are normal. Vascular/Lymphatic: The aorta is normal in caliber. No dissection. The branch vessels are patent. The major venous structures are patent. No mesenteric or retroperitoneal mass or adenopathy. Small scattered lymph nodes are noted. Reproductive: Uterine fibroids are noted. Uterine enlargement in the fundal region and difficult to distinguish the endometrium from the myometrium. This could be due to degenerated fibroid or possibly an endometrial process. Both ovaries appear normal. Essure closure devices are noted bilaterally. Other: No free pelvic fluid collections or pelvic adenopathy. No inguinal adenopathy or mass. No abdominal wall hernia or subcutaneous lesions. Musculoskeletal: No significant bony findings. IMPRESSION: 1. Normal appendix. 2. Abnormal appearance of the uterus as discussed above. Possible degenerated fibroid in the fundus versus an endometrial process. Pelvic ultrasound may be helpful for further  evaluation. Both ovaries appear normal. Essure closure devices are noted. 3. Small to moderate-sized hiatal hernia. 4. No acute abdominal findings, mass lesions or adenopathy. Electronically Signed   By: Marijo Sanes M.D.   On: 12/20/2017 11:08    Procedures Procedures (including critical care time)  Medications Ordered in ED Medications  ondansetron (ZOFRAN) injection 4 mg (4 mg Intravenous Given 12/20/17 0944)  morphine 4 MG/ML injection 4 mg (4 mg Intravenous Given 12/20/17 1028)  ondansetron (ZOFRAN) injection 4 mg (4 mg Intravenous Given 12/20/17 1028)  sodium chloride 0.9 % bolus 1,000 mL (0 mLs Intravenous Stopped 12/20/17 1146)  iopamidol (ISOVUE-300) 61 % injection 100 mL (100 mLs Intravenous Contrast Given 12/20/17 1040)     Initial Impression / Assessment and Plan / ED Course  I have reviewed the triage  vital signs and the nursing notes.  Pertinent labs & imaging results that were available during my care of the patient were reviewed by me and considered in my medical decision making (see chart for details).     Jenna Gardner is a 47 y.o. female with a past medical history significant for prior tubal ligation and anxiety who presents with right lower quadrant abdominal pain, nausea, and vomiting.  Patient reports that her symptoms have been ongoing since around 430 in this morning.  She reports she is currently on her menstrual cycle with vaginal bleeding.  She denies any vaginal discharge.  She denies any recent trauma.  She reports her pain is 8 out of 10 and across her lower abdomen in the right lower quadrant.  She denies history of kidney stones.  She reports a family history of uterine cancer.  She reports no constipation but does report an episode of diarrhea.  She reports her urine is slightly decreased in the setting of her decreased oral intake.  She reports the pain comes in waves.  She denies any other symptoms.    On exam, patient had tenderness in her right lower quadrant and supra pubic area.  No CVA tenderness or flank tenderness present.  Lungs clear chest nontender.  Clinically, we are concerned about appendicitis, diverticulitis, or other pain related to diarrhea.  Patient had laboratory testing and CT imaging to evaluate.  Patient had mild leukocyte ptosis with mild anemia.  Metabolic panel was reassuring.  Alfonse Spruce is negative.  Urinalysis showed hemoglobin but no evidence of nitrites or leukocytes.  Doubt UTI.  Lipase not elevated.  CT scan showed a uterine fibroid versus endometrial process.  Suspect this is the cause of symptoms.  Radiology recommended ultrasound.  Pelvic exam will be performed an ultrasound of the pelvis ordered.  Patient given pain medicine with improvement in symptoms.  Anticipate reassessment   Pelvic exam was unremarkable.  Wet prep showed no  abnormalities.  [place] committee was sent.  Ultrasound confirmed the fibroid.  Continue to feel fibroid is the cause the patient's pain.    Patient agreed with OB/GYN follow-up and PCP follow-up.  Patient prescription for pain medicine nausea medicine.  Patient had no other course or concerns and was discharged in good condition with improved pain.   Final Clinical Impressions(s) / ED Diagnoses   Final diagnoses:  Generalized abdominal pain  Uterine leiomyoma, unspecified location    ED Discharge Orders        Ordered    oxyCODONE-acetaminophen (PERCOCET/ROXICET) 5-325 MG tablet  Every 4 hours PRN     12/20/17 1415    ondansetron (ZOFRAN) 4 MG tablet  Every 8  hours PRN     12/20/17 1415      Clinical Impression: 1. Generalized abdominal pain   2. Uterine leiomyoma, unspecified location     Disposition: Discharge  Condition: Good  I have discussed the results, Dx and Tx plan with the pt(& family if present). He/she/they expressed understanding and agree(s) with the plan. Discharge instructions discussed at great length. Strict return precautions discussed and pt &/or family have verbalized understanding of the instructions. No further questions at time of discharge.    Discharge Medication List as of 12/20/2017  2:17 PM    START taking these medications   Details  ondansetron (ZOFRAN) 4 MG tablet Take 1 tablet (4 mg total) by mouth every 8 (eight) hours as needed., Starting Sun 12/20/2017, Print    oxyCODONE-acetaminophen (PERCOCET/ROXICET) 5-325 MG tablet Take 1 tablet by mouth every 4 (four) hours as needed for severe pain., Starting Sun 12/20/2017, Print        Follow Up: [facility], [address] [place] [address] [phone]     [place] [address] [id number] [place] [phone]       Tegeler, Gwenyth Allegra, MD 12/20/17 [phone]

## 2017-12-20 NOTE — ED Triage Notes (Signed)
RLQ pain with vomiting since early this morning.

## 2017-12-21 ENCOUNTER — Telehealth: Payer: Self-pay

## 2017-12-21 LAB — GC/CHLAMYDIA PROBE AMP (~~LOC~~) NOT AT ARMC
Chlamydia: NEGATIVE
Neisseria Gonorrhea: NEGATIVE

## 2017-12-21 NOTE — Telephone Encounter (Signed)
Called patient regarding ED follow up appointment. Left message for return call.

## 2017-12-22 NOTE — Telephone Encounter (Signed)
Patient returned call states she will have ED follow up with OB- GYN.

## 2018-01-05 DIAGNOSIS — N946 Dysmenorrhea, unspecified: Secondary | ICD-10-CM | POA: Diagnosis not present

## 2018-01-05 DIAGNOSIS — D251 Intramural leiomyoma of uterus: Secondary | ICD-10-CM | POA: Diagnosis not present

## 2018-01-05 DIAGNOSIS — D250 Submucous leiomyoma of uterus: Secondary | ICD-10-CM | POA: Diagnosis not present

## 2018-02-16 ENCOUNTER — Other Ambulatory Visit: Payer: Self-pay | Admitting: Obstetrics and Gynecology

## 2018-02-16 DIAGNOSIS — N6489 Other specified disorders of breast: Secondary | ICD-10-CM

## 2018-02-17 ENCOUNTER — Ambulatory Visit
Admission: RE | Admit: 2018-02-17 | Discharge: 2018-02-17 | Disposition: A | Discharge status: Home / Self Care | Payer: BLUE CROSS/BLUE SHIELD | Source: Ambulatory Visit | Attending: Emergency Medicine | Admitting: Emergency Medicine

## 2018-02-17 DIAGNOSIS — N6489 Other specified disorders of breast: Secondary | ICD-10-CM

## 2018-03-19 DIAGNOSIS — M546 Pain in thoracic spine: Secondary | ICD-10-CM | POA: Diagnosis not present

## 2018-03-19 DIAGNOSIS — M9902 Segmental and somatic dysfunction of thoracic region: Secondary | ICD-10-CM | POA: Diagnosis not present

## 2018-03-19 DIAGNOSIS — M542 Cervicalgia: Secondary | ICD-10-CM | POA: Diagnosis not present

## 2018-03-19 DIAGNOSIS — M9901 Segmental and somatic dysfunction of cervical region: Secondary | ICD-10-CM | POA: Diagnosis not present

## 2018-03-24 DIAGNOSIS — M546 Pain in thoracic spine: Secondary | ICD-10-CM | POA: Diagnosis not present

## 2018-03-24 DIAGNOSIS — M9902 Segmental and somatic dysfunction of thoracic region: Secondary | ICD-10-CM | POA: Diagnosis not present

## 2018-03-24 DIAGNOSIS — M9901 Segmental and somatic dysfunction of cervical region: Secondary | ICD-10-CM | POA: Diagnosis not present

## 2018-03-24 DIAGNOSIS — M542 Cervicalgia: Secondary | ICD-10-CM | POA: Diagnosis not present

## 2018-03-29 DIAGNOSIS — M9902 Segmental and somatic dysfunction of thoracic region: Secondary | ICD-10-CM | POA: Diagnosis not present

## 2018-03-29 DIAGNOSIS — M546 Pain in thoracic spine: Secondary | ICD-10-CM | POA: Diagnosis not present

## 2018-03-29 DIAGNOSIS — M542 Cervicalgia: Secondary | ICD-10-CM | POA: Diagnosis not present

## 2018-03-29 DIAGNOSIS — M9901 Segmental and somatic dysfunction of cervical region: Secondary | ICD-10-CM | POA: Diagnosis not present

## 2018-04-02 DIAGNOSIS — M9902 Segmental and somatic dysfunction of thoracic region: Secondary | ICD-10-CM | POA: Diagnosis not present

## 2018-04-02 DIAGNOSIS — M542 Cervicalgia: Secondary | ICD-10-CM | POA: Diagnosis not present

## 2018-04-02 DIAGNOSIS — M9901 Segmental and somatic dysfunction of cervical region: Secondary | ICD-10-CM | POA: Diagnosis not present

## 2018-04-02 DIAGNOSIS — M546 Pain in thoracic spine: Secondary | ICD-10-CM | POA: Diagnosis not present

## 2018-04-08 DIAGNOSIS — M542 Cervicalgia: Secondary | ICD-10-CM | POA: Diagnosis not present

## 2018-04-08 DIAGNOSIS — M546 Pain in thoracic spine: Secondary | ICD-10-CM | POA: Diagnosis not present

## 2018-04-08 DIAGNOSIS — M9901 Segmental and somatic dysfunction of cervical region: Secondary | ICD-10-CM | POA: Diagnosis not present

## 2018-04-08 DIAGNOSIS — M9902 Segmental and somatic dysfunction of thoracic region: Secondary | ICD-10-CM | POA: Diagnosis not present

## 2018-04-15 DIAGNOSIS — M9901 Segmental and somatic dysfunction of cervical region: Secondary | ICD-10-CM | POA: Diagnosis not present

## 2018-04-15 DIAGNOSIS — M9902 Segmental and somatic dysfunction of thoracic region: Secondary | ICD-10-CM | POA: Diagnosis not present

## 2018-04-15 DIAGNOSIS — M542 Cervicalgia: Secondary | ICD-10-CM | POA: Diagnosis not present

## 2018-04-15 DIAGNOSIS — M546 Pain in thoracic spine: Secondary | ICD-10-CM | POA: Diagnosis not present

## 2018-04-27 DIAGNOSIS — M546 Pain in thoracic spine: Secondary | ICD-10-CM | POA: Diagnosis not present

## 2018-04-27 DIAGNOSIS — M9901 Segmental and somatic dysfunction of cervical region: Secondary | ICD-10-CM | POA: Diagnosis not present

## 2018-04-27 DIAGNOSIS — M542 Cervicalgia: Secondary | ICD-10-CM | POA: Diagnosis not present

## 2018-04-27 DIAGNOSIS — M9902 Segmental and somatic dysfunction of thoracic region: Secondary | ICD-10-CM | POA: Diagnosis not present

## 2018-05-03 ENCOUNTER — Encounter: Payer: Self-pay | Admitting: Internal Medicine

## 2018-05-03 ENCOUNTER — Ambulatory Visit: Payer: BLUE CROSS/BLUE SHIELD | Admitting: Internal Medicine

## 2018-05-03 VITALS — BP 124/78 | HR 91 | Temp 97.8°F | Resp 16 | Ht 66.0 in | Wt 225.0 lb

## 2018-05-03 DIAGNOSIS — B349 Viral infection, unspecified: Secondary | ICD-10-CM

## 2018-05-03 LAB — POCT INFLUENZA A/B
INFLUENZA A, POC: NEGATIVE
INFLUENZA B, POC: NEGATIVE

## 2018-05-03 LAB — POCT RAPID STREP A (OFFICE): Rapid Strep A Screen: NEGATIVE

## 2018-05-03 MED ORDER — AZELASTINE HCL 0.1 % NA SOLN: 2.0000 | Freq: Two times a day (BID) | NASAL | 6 refills | Status: DC

## 2018-05-03 MED ORDER — DOXYCYCLINE HYCLATE 100 MG PO CAPS: 100.0000 mg | ORAL_CAPSULE | Freq: Two times a day (BID) | ORAL | 0 refills | Status: DC

## 2018-05-03 NOTE — Patient Instructions (Signed)
Rest, fluids , tylenol  For cough:  Take Mucinex DM twice a day as needed until better  For nasal congestion: Use OTC  Flonase : 2 nasal sprays on each side of the nose in the morning until you feel better Use ASTELIN a prescribed spray : 2 nasal sprays on each side of the nose twice a day until you feel better  Get pseudoephedrine 30 mg (behind the counter, you need to talk with the pharmacist) take one tablet 3 or 4 times a day as needed for congestion  Take the antibiotic as prescribed (doxycycline)  if no better in 4-5 days   Call if not gradually better over the next  10 days  Call anytime if the symptoms are severe

## 2018-05-03 NOTE — Progress Notes (Signed)
Pre visit review using our clinic review tool, if applicable. No additional management support is needed unless otherwise documented below in the visit note. 

## 2018-05-03 NOTE — Progress Notes (Signed)
Subjective:    Patient ID: Jenna Gardner, female    DOB: 1970/08/11, 47 y.o.   MRN: [id number]  DOS:  05/03/2018 Type of visit - description : acute Symptoms of started a week ago with runny nose, sinus congestion, bilateral ear ache, low-grade fever and sore throat. Symptoms were at its peak 3 days ago.  Slightly better now. She complains of malaise and is sleeping frequently during the daytime. Occasional cough, not a major issue. Taking Zyrtec and NyQuil.   Review of Systems Had some nausea but no vomiting or diarrhea.  No headache or a rash No sick contacts  Past Medical History:  Diagnosis Date  . Contraception    essure  . Thyromegaly     Past Surgical History:  Procedure Laterality Date  . CESAREAN SECTION     1996  . g3 p3    . TUBAL LIGATION     essure     Social History   Socioeconomic History  . Marital status: Married    Spouse name: Christy Sartorius  . Number of children: 3  . Years of education: Not on file  . Highest education level: Not on file  Occupational History  . Occupation: Engineer, maintenance (IT): [employer]  Social Needs  . Financial resource strain: Not on file  . Food insecurity:    Worry: Not on file    Inability: Not on file  . Transportation needs:    Medical: Not on file    Non-medical: Not on file  Tobacco Use  . Smoking status: Never Smoker  . Smokeless tobacco: Never Used  Substance and Sexual Activity  . Alcohol use: Yes    Comment: daily  . Drug use: No  . Sexual activity: Yes    Birth control/protection: None  Lifestyle  . Physical activity:    Days per week: Not on file    Minutes per session: Not on file  . Stress: Not on file  Relationships  . Social connections:    Talks on phone: Not on file    Gets together: Not on file    Attends religious service: Not on file    Active member of club or organization: Not on file    Attends meetings of clubs or organizations: Not on  file    Relationship status: Not on file  . Intimate partner violence:    Fear of current or ex partner: Not on file    Emotionally abused: Not on file    Physically abused: Not on file    Forced sexual activity: Not on file  Other Topics Concern  . Not on file  Social History Narrative   Household:  pt, husband, 1 child, pt's mother       Allergies as of 05/03/2018      Reactions   Bactrim Rash, Other (See Comments)   Question af allergic reaction, see OV 07-15-2011    Penicillins Hives, Other (See Comments)   Childhood allergy   Sulfamethoxazole-trimethoprim Rash   Question af allergic reaction, see OV 07-15-2011  Question af allergic reaction, see OV 07-15-2011       Medication List        Accurate as of 05/03/18 11:59 PM. Always use your most recent med list.          azelastine 0.1 % nasal spray Commonly known as:  ASTELIN Place 2 sprays into both nostrils 2 (two) times  daily.   doxycycline 100 MG capsule Commonly known as:  VIBRAMYCIN Take 1 capsule (100 mg total) by mouth 2 (two) times daily.           Objective:   Physical Exam BP 124/78 (BP Location: Left Arm, Patient Position: Sitting, Cuff Size: Normal)   Pulse 91   Temp 97.8 F (36.6 C) (Oral)   Resp 16   Ht 5\' 6"  (1.676 m)   Wt 225 lb (102.1 kg)   SpO2 98%   BMI 36.32 kg/m  General:   Well developed, NAD, BMI noted. HEENT:  Normocephalic . Face symmetric, atraumatic. Nose quite congested, throat symmetric and not red. Right TM: Normal.  Left TM: Slightly red, no bulge. Lungs:  CTA B Normal respiratory effort, no intercostal retractions, no accessory muscle use. Heart: RRR,  no murmur.  No pretibial edema bilaterally  Skin: Not pale. Not jaundice Neurologic:  alert & oriented X3.  Speech normal, gait appropriate for age and unassisted Psych--  Cognition and judgment appear intact.  Cooperative with normal attention span and concentration.  Behavior appropriate. No anxious or  depressed appearing.      Assessment & Plan:     Assessment H/o Tubal ligation Essure H/o thyromegaly - nl [place] 2014 FH CAD, admitted with chest pain 01-2014, stress test negative   PLAN: Viral syndrome: Strep and influenza tests came back negative, most likely has a virus. Recommend supportive treatment with Mucinex DM, Flonase, Astelin, low-dose decongestants Start antibiotic if not improving.  See AVS

## 2018-05-04 NOTE — Assessment & Plan Note (Signed)
Viral syndrome: Strep and influenza tests came back negative, most likely has a virus. Recommend supportive treatment with Mucinex DM, Flonase, Astelin, low-dose decongestants Start antibiotic if not improving.  See AVS

## 2018-06-01 ENCOUNTER — Ambulatory Visit (INDEPENDENT_AMBULATORY_CARE_PROVIDER_SITE_OTHER): Payer: BLUE CROSS/BLUE SHIELD

## 2018-06-01 DIAGNOSIS — Z23 Encounter for immunization: Secondary | ICD-10-CM

## 2018-07-21 ENCOUNTER — Other Ambulatory Visit: Payer: Self-pay | Admitting: Emergency Medicine

## 2018-07-21 DIAGNOSIS — Z1231 Encounter for screening mammogram for malignant neoplasm of breast: Secondary | ICD-10-CM

## 2018-08-11 DIAGNOSIS — L570 Actinic keratosis: Secondary | ICD-10-CM | POA: Diagnosis not present

## 2018-08-11 DIAGNOSIS — L821 Other seborrheic keratosis: Secondary | ICD-10-CM | POA: Diagnosis not present

## 2018-09-30 ENCOUNTER — Encounter: Payer: BLUE CROSS/BLUE SHIELD | Admitting: Internal Medicine

## 2018-12-03 ENCOUNTER — Other Ambulatory Visit (HOSPITAL_BASED_OUTPATIENT_CLINIC_OR_DEPARTMENT_OTHER): Payer: Self-pay | Admitting: Obstetrics and Gynecology

## 2018-12-03 DIAGNOSIS — Z1231 Encounter for screening mammogram for malignant neoplasm of breast: Secondary | ICD-10-CM

## 2018-12-23 ENCOUNTER — Encounter: Payer: Self-pay | Admitting: Internal Medicine

## 2018-12-23 ENCOUNTER — Ambulatory Visit (INDEPENDENT_AMBULATORY_CARE_PROVIDER_SITE_OTHER): Payer: BC Managed Care – PPO | Admitting: Internal Medicine

## 2018-12-23 ENCOUNTER — Other Ambulatory Visit: Payer: Self-pay

## 2018-12-23 ENCOUNTER — Ambulatory Visit (HOSPITAL_BASED_OUTPATIENT_CLINIC_OR_DEPARTMENT_OTHER)
Admission: RE | Admit: 2018-12-23 | Discharge: 2018-12-23 | Disposition: A | Discharge status: Home / Self Care | Payer: BC Managed Care – PPO | Source: Ambulatory Visit | Attending: Obstetrics and Gynecology | Admitting: Obstetrics and Gynecology

## 2018-12-23 VITALS — BP 127/78 | HR 74 | Temp 98.6°F | Resp 16 | Ht 66.0 in | Wt 212.2 lb

## 2018-12-23 DIAGNOSIS — E538 Deficiency of other specified B group vitamins: Secondary | ICD-10-CM

## 2018-12-23 DIAGNOSIS — Z0000 Encounter for general adult medical examination without abnormal findings: Secondary | ICD-10-CM

## 2018-12-23 DIAGNOSIS — Z1231 Encounter for screening mammogram for malignant neoplasm of breast: Secondary | ICD-10-CM | POA: Diagnosis not present

## 2018-12-23 DIAGNOSIS — R739 Hyperglycemia, unspecified: Secondary | ICD-10-CM | POA: Diagnosis not present

## 2018-12-23 DIAGNOSIS — D649 Anemia, unspecified: Secondary | ICD-10-CM

## 2018-12-23 DIAGNOSIS — Z1211 Encounter for screening for malignant neoplasm of colon: Secondary | ICD-10-CM

## 2018-12-23 LAB — LIPID PANEL
Cholesterol: 204 mg/dL — ABNORMAL HIGH (ref 0–200)
HDL: 68.5 mg/dL (ref >39.00)
LDL Cholesterol: 114 mg/dL — ABNORMAL HIGH (ref 0–99)
NonHDL: 135.52
Total CHOL/HDL Ratio: 3
Triglycerides: 109 mg/dL (ref 0.0–149.0)
VLDL: 21.8 mg/dL (ref 0.0–40.0)

## 2018-12-23 LAB — CBC WITH DIFFERENTIAL/PLATELET
Basophils Absolute: 0.1 10*3/uL (ref 0.0–0.1)
Basophils Relative: 1.4 % (ref 0.0–3.0)
Eosinophils Absolute: 0.2 10*3/uL (ref 0.0–0.7)
Eosinophils Relative: 2.8 % (ref 0.0–5.0)
HCT: 37.9 % (ref 36.0–46.0)
Hemoglobin: 12.4 g/dL (ref 12.0–15.0)
Lymphocytes Relative: 26.5 % (ref 12.0–46.0)
Lymphs Abs: 1.9 10*3/uL (ref 0.7–4.0)
MCHC: 32.8 g/dL (ref 30.0–36.0)
MCV: 88.5 fl (ref 78.0–100.0)
Monocytes Absolute: 0.5 10*3/uL (ref 0.1–1.0)
Monocytes Relative: 7.2 % (ref 3.0–12.0)
Neutro Abs: 4.5 10*3/uL (ref 1.4–7.7)
Neutrophils Relative %: 62.1 % (ref 43.0–77.0)
Platelets: 312 10*3/uL (ref 150.0–400.0)
RBC: 4.28 Mil/uL (ref 3.87–5.11)
RDW: 14 % (ref 11.5–15.5)
WBC: 7.3 10*3/uL (ref 4.0–10.5)

## 2018-12-23 LAB — COMPREHENSIVE METABOLIC PANEL
ALT: 13 U/L (ref 0–35)
AST: 15 U/L (ref 0–37)
Albumin: 4.2 g/dL (ref 3.5–5.2)
Alkaline Phosphatase: 69 U/L (ref 39–117)
BUN: 7 mg/dL (ref 6–23)
CO2: 24 mEq/L (ref 19–32)
Calcium: 9.7 mg/dL (ref 8.4–10.5)
Chloride: 103 mEq/L (ref 96–112)
Creatinine, Ser: 0.84 mg/dL (ref 0.40–1.20)
GFR: 71.91 mL/min (ref >60.00)
Glucose, Bld: 83 mg/dL (ref 70–99)
Potassium: 4.3 mEq/L (ref 3.5–5.1)
Sodium: 137 mEq/L (ref 135–145)
Total Bilirubin: 0.5 mg/dL (ref 0.2–1.2)
Total Protein: 7 g/dL (ref 6.0–8.3)

## 2018-12-23 LAB — FERRITIN: Ferritin: 17.4 ng/mL (ref 10.0–291.0)

## 2018-12-23 LAB — VITAMIN B12: Vitamin B-12: 152 pg/mL — ABNORMAL LOW (ref 211–911)

## 2018-12-23 LAB — HEMOGLOBIN A1C: Hgb A1c MFr Bld: 5.2 % (ref 4.6–6.5)

## 2018-12-23 LAB — IRON: Iron: 85 ug/dL (ref 42–145)

## 2018-12-23 NOTE — Assessment & Plan Note (Addendum)
--  Td 09/2017. - flu shot recommended when available --Female care: saw gyn 12/2017 -FH breast cancer (aunt, 2 cousins): last  MMG done today --CCS: never had a cscope; guidelines are changing to start screening at early age , pt desires a cscope, will refer   --Diet: healthy, has lost weight - -exercise: doing well, daily walk - -+ FH CAD, negative stress test 2015  --Labs: B12, CMP, FLP, CBC, A1c, iron, ferritin

## 2018-12-23 NOTE — Progress Notes (Signed)
Pre visit review using our clinic review tool, if applicable. No additional management support is needed unless otherwise documented below in the visit note. 

## 2018-12-23 NOTE — Progress Notes (Signed)
Subjective:    Patient ID: Jenna Gardner, female    DOB: 12-20-1970, 49 y.o.   MRN: [id number]  DOS:  12/23/2018 Type of visit - description: CPX No major concerns  Wt Readings from Last 3 Encounters:  12/23/18 212 lb 4 oz (96.3 kg)  05/03/18 225 lb (102.1 kg)  12/20/17 220 lb (99.8 kg)     Review of Systems Exercises regularly She reports periods are every 4 weeks, very heavy for 2 days, then essentially period stop.  Other than above, a 14 point review of systems is negative    Past Medical History:  Diagnosis Date  . Contraception    essure  . Thyromegaly     Past Surgical History:  Procedure Laterality Date  . CESAREAN SECTION     1996  . g3 p3    . TUBAL LIGATION     essure     Social History   Socioeconomic History  . Marital status: Married    Spouse name: Christy Sartorius  . Number of children: 3  . Years of education: Not on file  . Highest education level: Not on file  Occupational History  . Occupation: Engineer, maintenance (IT): [employer]  Social Needs  . Financial resource strain: Not on file  . Food insecurity    Worry: Not on file    Inability: Not on file  . Transportation needs    Medical: Not on file    Non-medical: Not on file  Tobacco Use  . Smoking status: Never Smoker  . Smokeless tobacco: Never Used  Substance and Sexual Activity  . Alcohol use: Yes    Comment: daily  . Drug use: No  . Sexual activity: Yes    Birth control/protection: None  Lifestyle  . Physical activity    Days per week: Not on file    Minutes per session: Not on file  . Stress: Not on file  Relationships  . Social Herbalist on phone: Not on file    Gets together: Not on file    Attends religious service: Not on file    Active member of club or organization: Not on file    Attends meetings of clubs or organizations: Not on file    Relationship status: Not on file  . Intimate partner violence    Fear of  current or ex partner: Not on file    Emotionally abused: Not on file    Physically abused: Not on file    Forced sexual activity: Not on file  Other Topics Concern  . Not on file  Social History Narrative   Household:  pt, husband, 1 child, pt's mother      Family History  Problem Relation Age of Onset  . Heart disease Mother 54        MI age 82   . Uterine cancer Mother   . Breast cancer Paternal Aunt        aunt, 2 cousins; early dx (25s)  . Diabetes Father   . Heart attack Father 52       first MI age 57  . Hypertension Neg Hx   . Stroke Neg Hx   . Colon cancer Neg Hx      Allergies as of 12/23/2018      Reactions   Bactrim Rash, Other (See Comments)   Question af allergic reaction, see OV 07-15-2011  Penicillins Hives, Other (See Comments)   Childhood allergy   Sulfamethoxazole-trimethoprim Rash   Question af allergic reaction, see OV 07-15-2011  Question af allergic reaction, see OV 07-15-2011       Medication List       Accurate as of December 23, 2018 11:59 PM. If you have any questions, ask your nurse or doctor.        STOP taking these medications   azelastine 0.1 % nasal spray Commonly known as: ASTELIN Stopped by: Kathlene November, MD   doxycycline 100 MG capsule Commonly known as: VIBRAMYCIN Stopped by: Kathlene November, MD     TAKE these medications   cholecalciferol 25 MCG (1000 UT) tablet Commonly known as: VITAMIN D3 Take 1,000 Units by mouth daily.   vitamin B-12 1000 MCG tablet Commonly known as: CYANOCOBALAMIN Take 1,000 mcg by mouth daily.           Objective:   Physical Exam BP 127/78 (BP Location: Left Arm, Patient Position: Sitting, Cuff Size: Small)   Pulse 74   Temp 98.6 F (37 C) (Oral)   Resp 16   Ht 5\' 6"  (1.676 m)   Wt 212 lb 4 oz (96.3 kg)   LMP 11/25/2018   SpO2 100%   BMI 34.26 kg/m   General: Well developed, NAD, BMI noted Neck: No  thyromegaly  HEENT:  Normocephalic . Face symmetric, atraumatic Lungs:  CTA B Normal  respiratory effort, no intercostal retractions, no accessory muscle use. Heart: RRR,  no murmur.  No pretibial edema bilaterally  Abdomen:  Not distended, soft, non-tender. No rebound or rigidity.   Skin: Exposed areas without rash. Not pale. Not jaundice Neurologic:  alert & oriented X3.  Speech normal, gait appropriate for age and unassisted Strength symmetric and appropriate for age.  Psych: Cognition and judgment appear intact.  Cooperative with normal attention span and concentration.  Behavior appropriate. No anxious or depressed appearing.     Assessment      Assessment B12 deficiency H/o Tubal ligation Essure H/o thyromegaly - nl [place] 2014 FH CAD, admitted with chest pain 01-2014, stress test negative   PLAN: Here for CPX Vitamin B12 deficiency: On oral supplements, checking labs. Anemia: Mild, noted on last CBC, she donates blood approximately 3 times a year, still have her periods, heavy for 2 days q month.  Checking labs. RTC 1 year

## 2018-12-23 NOTE — Patient Instructions (Addendum)
GO TO THE LAB : Get the blood work     GO TO THE FRONT DESK Schedule your next appointment   for a physical exam in 1 year 

## 2018-12-24 NOTE — Assessment & Plan Note (Signed)
Here for CPX Vitamin B12 deficiency: On oral supplements, checking labs. Anemia: Mild, noted on last CBC, she donates blood approximately 3 times a year, still have her periods, heavy for 2 days q month.  Checking labs. RTC 1 year

## 2018-12-30 ENCOUNTER — Encounter: Payer: Self-pay | Admitting: Internal Medicine

## 2018-12-31 ENCOUNTER — Telehealth: Payer: Self-pay

## 2018-12-31 NOTE — Addendum Note (Signed)
Addended byDamita Dunnings D on: 12/31/2018 12:37 PM   Modules accepted: Orders

## 2018-12-31 NOTE — Telephone Encounter (Signed)
Cologuard ordered through [employer] portal.  

## 2019-01-11 ENCOUNTER — Encounter: Payer: Self-pay | Admitting: Internal Medicine

## 2019-01-11 DIAGNOSIS — Z1211 Encounter for screening for malignant neoplasm of colon: Secondary | ICD-10-CM

## 2019-01-12 ENCOUNTER — Ambulatory Visit (INDEPENDENT_AMBULATORY_CARE_PROVIDER_SITE_OTHER): Payer: BC Managed Care – PPO | Admitting: *Deleted

## 2019-01-12 ENCOUNTER — Other Ambulatory Visit: Payer: Self-pay

## 2019-01-12 VITALS — Temp 98.2°F

## 2019-01-12 DIAGNOSIS — E538 Deficiency of other specified B group vitamins: Secondary | ICD-10-CM

## 2019-01-12 MED ORDER — CYANOCOBALAMIN 1000 MCG/ML IJ SOLN
1000.0000 ug | Freq: Once | INTRAMUSCULAR | Status: AC
  Administered 2019-01-12: 1000 ug via INTRAMUSCULAR

## 2019-01-12 NOTE — Progress Notes (Signed)
Patient is here today for B12 injection.  Today is first shot of the weekly injections.  Injection given and patient tolerated well.  Second weekly injection due on or around 01/19/19.  Patient scheduled for next weekly injection.

## 2019-01-19 ENCOUNTER — Ambulatory Visit: Payer: BC Managed Care – PPO

## 2019-01-20 ENCOUNTER — Other Ambulatory Visit: Payer: Self-pay

## 2019-01-20 ENCOUNTER — Ambulatory Visit (INDEPENDENT_AMBULATORY_CARE_PROVIDER_SITE_OTHER): Payer: BC Managed Care – PPO | Admitting: *Deleted

## 2019-01-20 DIAGNOSIS — E538 Deficiency of other specified B group vitamins: Secondary | ICD-10-CM | POA: Diagnosis not present

## 2019-01-20 MED ORDER — CYANOCOBALAMIN 1000 MCG/ML IJ SOLN: 1000.0000 ug | Freq: Once | INTRAMUSCULAR | 0 refills | Status: DC

## 2019-01-20 MED ORDER — CYANOCOBALAMIN 1000 MCG/ML IJ SOLN
1000.0000 ug | Freq: Once | INTRAMUSCULAR | Status: AC
  Administered 2019-01-20: 1000 ug via INTRAMUSCULAR

## 2019-01-20 NOTE — Progress Notes (Signed)
Patient is here today for B12 injection.  Today is second shot of the weekly injections.  Injection given and patient tolerated well.  Third weekly injection due on or around 01/27/19.  Patient scheduled for next weekly injection.

## 2019-01-27 ENCOUNTER — Other Ambulatory Visit: Payer: Self-pay

## 2019-01-27 ENCOUNTER — Ambulatory Visit (INDEPENDENT_AMBULATORY_CARE_PROVIDER_SITE_OTHER): Payer: BC Managed Care – PPO

## 2019-01-27 DIAGNOSIS — E538 Deficiency of other specified B group vitamins: Secondary | ICD-10-CM

## 2019-01-27 MED ORDER — CYANOCOBALAMIN 1000 MCG/ML IJ SOLN
1000.0000 ug | Freq: Once | INTRAMUSCULAR | Status: AC
  Administered 2019-01-27: 11:00:00 1000 ug via INTRAMUSCULAR

## 2019-01-27 NOTE — Progress Notes (Addendum)
B12 injection administered w/o any difficulties.    Kathlene November, MD

## 2019-02-03 ENCOUNTER — Other Ambulatory Visit: Payer: Self-pay

## 2019-02-03 ENCOUNTER — Ambulatory Visit (INDEPENDENT_AMBULATORY_CARE_PROVIDER_SITE_OTHER): Payer: BC Managed Care – PPO

## 2019-02-03 DIAGNOSIS — E538 Deficiency of other specified B group vitamins: Secondary | ICD-10-CM | POA: Diagnosis not present

## 2019-02-03 MED ORDER — CYANOCOBALAMIN 1000 MCG/ML IJ SOLN
1000.0000 ug | Freq: Once | INTRAMUSCULAR | Status: AC
  Administered 2019-02-03: 1000 ug via INTRAMUSCULAR

## 2019-02-03 NOTE — Progress Notes (Addendum)
Pt here for weekly B12 injection per Dr. Larose Kells  B12 1060mcg given IM in left deltoid, and pt tolerated injection well.  Next B12 injection scheduled for next month as this was her last weekly dose.   Kathlene November, MD

## 2019-03-09 ENCOUNTER — Ambulatory Visit (INDEPENDENT_AMBULATORY_CARE_PROVIDER_SITE_OTHER): Payer: BC Managed Care – PPO | Admitting: *Deleted

## 2019-03-09 ENCOUNTER — Other Ambulatory Visit: Payer: Self-pay

## 2019-03-09 DIAGNOSIS — E538 Deficiency of other specified B group vitamins: Secondary | ICD-10-CM | POA: Diagnosis not present

## 2019-03-09 DIAGNOSIS — Z23 Encounter for immunization: Secondary | ICD-10-CM

## 2019-03-09 MED ORDER — CYANOCOBALAMIN 1000 MCG/ML IJ SOLN
1000.0000 ug | Freq: Once | INTRAMUSCULAR | Status: AC
  Administered 2019-03-09: 1000 ug via INTRAMUSCULAR

## 2019-03-09 NOTE — Patient Instructions (Signed)
j

## 2019-03-09 NOTE — Progress Notes (Signed)
Pt here for monthly B12 injection per Dr. Larose Kells.  She also wants flu vaccine today.  B12 1054mcg given IM in left deltoid and flu vaccine in right deltoid. Patient tolerated both injections well.  Next B12 injection scheduled for next month.

## 2019-04-07 DIAGNOSIS — N920 Excessive and frequent menstruation with regular cycle: Secondary | ICD-10-CM | POA: Diagnosis not present

## 2019-04-07 DIAGNOSIS — D251 Intramural leiomyoma of uterus: Secondary | ICD-10-CM | POA: Diagnosis not present

## 2019-04-07 DIAGNOSIS — Z01419 Encounter for gynecological examination (general) (routine) without abnormal findings: Secondary | ICD-10-CM | POA: Diagnosis not present

## 2019-04-07 DIAGNOSIS — Z975 Presence of (intrauterine) contraceptive device: Secondary | ICD-10-CM | POA: Diagnosis not present

## 2019-04-08 ENCOUNTER — Ambulatory Visit (INDEPENDENT_AMBULATORY_CARE_PROVIDER_SITE_OTHER): Payer: BC Managed Care – PPO

## 2019-04-08 ENCOUNTER — Other Ambulatory Visit: Payer: Self-pay

## 2019-04-08 DIAGNOSIS — E538 Deficiency of other specified B group vitamins: Secondary | ICD-10-CM

## 2019-04-08 MED ORDER — CYANOCOBALAMIN 1000 MCG/ML IJ SOLN
1000.0000 ug | Freq: Once | INTRAMUSCULAR | Status: AC
  Administered 2019-04-08: 11:00:00 1000 ug via INTRAMUSCULAR

## 2019-04-08 NOTE — Progress Notes (Addendum)
Pt here for monthly B12 injection per Dr. Larose Kells  B12 1028mcg given IM in right deltoid and pt tolerated injection well.  Patient declines further injections she states they are not covered by insurance and states she does not honestly feel a difference.   Please advise on what patient needs to do.   See phone note.  Will try a  higher oral dose and a injection every 3 months  Kathlene November, MD

## 2019-04-09 ENCOUNTER — Telehealth: Payer: Self-pay | Admitting: Internal Medicine

## 2019-04-09 NOTE — Telephone Encounter (Addendum)
Please call the patient. She reports that B12 injections are not covered by insurance. Recommend to restart oral B12 supplements, at the minimum 2000 mcg daily. We will do a B12 injection every 3 months Please arrange injections  Alternatively, she could to start self injections monthly

## 2019-04-13 NOTE — Telephone Encounter (Signed)
Tried calling Pt- someone answered then hung up. If she calls back okay for PEC to discuss.

## 2019-04-19 NOTE — Telephone Encounter (Signed)
Letter mailed to Pt w/ information.

## 2019-07-21 ENCOUNTER — Encounter: Payer: Self-pay | Admitting: Internal Medicine

## 2019-07-21 ENCOUNTER — Encounter: Payer: Self-pay | Admitting: Gastroenterology

## 2019-07-21 DIAGNOSIS — Z1211 Encounter for screening for malignant neoplasm of colon: Secondary | ICD-10-CM

## 2019-08-19 ENCOUNTER — Other Ambulatory Visit: Payer: Self-pay

## 2019-08-19 ENCOUNTER — Ambulatory Visit (AMBULATORY_SURGERY_CENTER): Payer: Self-pay | Admitting: *Deleted

## 2019-08-19 VITALS — Temp 96.9°F | Ht 66.5 in | Wt 215.0 lb

## 2019-08-19 DIAGNOSIS — Z01818 Encounter for other preprocedural examination: Secondary | ICD-10-CM

## 2019-08-19 DIAGNOSIS — Z1211 Encounter for screening for malignant neoplasm of colon: Secondary | ICD-10-CM

## 2019-08-19 NOTE — Progress Notes (Signed)
Patient is here in-person for PV. Patient denies any allergies to eggs or soy. Patient denies any problems with anesthesia/sedation. Patient denies any oxygen use at home. Patient denies taking any diet/weight loss medications or blood thinners. Patient is not being treated for MRSA or C-diff. EMMI education assisgned to the patient for the procedure, this was explained and instructions given to patient. COVID-19 screening test is on 08/31/2019, the pt is aware.  Patient is aware of our care-partner policy and 0000000 safety protocol.

## 2019-08-31 ENCOUNTER — Ambulatory Visit (INDEPENDENT_AMBULATORY_CARE_PROVIDER_SITE_OTHER): Payer: 59

## 2019-08-31 ENCOUNTER — Other Ambulatory Visit: Payer: Self-pay | Admitting: Gastroenterology

## 2019-08-31 DIAGNOSIS — Z1159 Encounter for screening for other viral diseases: Secondary | ICD-10-CM

## 2019-09-01 ENCOUNTER — Encounter: Payer: Self-pay | Admitting: Gastroenterology

## 2019-09-01 LAB — SARS CORONAVIRUS 2 (TAT 6-24 HRS): SARS Coronavirus 2: NEGATIVE

## 2019-09-02 ENCOUNTER — Ambulatory Visit (AMBULATORY_SURGERY_CENTER): Payer: 59 | Admitting: Gastroenterology

## 2019-09-02 ENCOUNTER — Other Ambulatory Visit: Payer: Self-pay

## 2019-09-02 ENCOUNTER — Encounter: Payer: Self-pay | Admitting: Gastroenterology

## 2019-09-02 VITALS — BP 123/73 | HR 70 | Temp 96.6°F | Resp 19 | Ht 66.0 in | Wt 215.0 lb

## 2019-09-02 DIAGNOSIS — Z1211 Encounter for screening for malignant neoplasm of colon: Secondary | ICD-10-CM | POA: Diagnosis not present

## 2019-09-02 DIAGNOSIS — K635 Polyp of colon: Secondary | ICD-10-CM

## 2019-09-02 DIAGNOSIS — D124 Benign neoplasm of descending colon: Secondary | ICD-10-CM

## 2019-09-02 MED ORDER — SODIUM CHLORIDE 0.9 % IV SOLN: 500.0000 mL | Freq: Once | INTRAVENOUS | Status: DC

## 2019-09-02 NOTE — Progress Notes (Signed)
Called to room to assist during endoscopic procedure.  Patient ID and intended procedure confirmed with present staff. Received instructions for my participation in the procedure from the performing physician.  

## 2019-09-02 NOTE — Progress Notes (Signed)
Pt's states no medical or surgical changes since previsit or office visit. Temp by JB. VS by CW.

## 2019-09-02 NOTE — Patient Instructions (Signed)
HANDOUT ON POLYPS & HEMORRHOIDS GIVEN TO YOU TODAY  AWAIT PATHOLOGY RESULTS ON POLYP REMOVED     YOU HAD AN ENDOSCOPIC PROCEDURE TODAY AT [facility]:   Refer to the procedure report that was given to you for any specific questions about what was found during the examination.  If the procedure report does not answer your questions, please call your gastroenterologist to clarify.  If you requested that your care partner not be given the details of your procedure findings, then the procedure report has been included in a sealed envelope for you to review at your convenience later.  YOU SHOULD EXPECT: Some feelings of bloating in the abdomen. Passage of more gas than usual.  Walking can help get rid of the air that was put into your GI tract during the procedure and reduce the bloating. If you had a lower endoscopy (such as a colonoscopy or flexible sigmoidoscopy) you may notice spotting of blood in your stool or on the toilet paper. If you underwent a bowel prep for your procedure, you may not have a normal bowel movement for a few days.  Please Note:  You might notice some irritation and congestion in your nose or some drainage.  This is from the oxygen used during your procedure.  There is no need for concern and it should clear up in a day or so.  SYMPTOMS TO REPORT IMMEDIATELY:   Following lower endoscopy (colonoscopy or flexible sigmoidoscopy):  Excessive amounts of blood in the stool  Significant tenderness or worsening of abdominal pains  Swelling of the abdomen that is new, acute  Fever of 100F or higher    For urgent or emergent issues, a gastroenterologist can be reached at any hour by calling [phone]. Do not use MyChart messaging for urgent concerns.    DIET:  We do recommend a small meal at first, but then you may proceed to your regular diet.  Drink plenty of fluids but you should avoid alcoholic beverages for 24 hours.  ACTIVITY:  You should plan  to take it easy for the rest of today and you should NOT DRIVE or use heavy machinery until tomorrow (because of the sedation medicines used during the test).    FOLLOW UP: Our staff will call the number listed on your records 48-72 hours following your procedure to check on you and address any questions or concerns that you may have regarding the information given to you following your procedure. If we do not reach you, we will leave a message.  We will attempt to reach you two times.  During this call, we will ask if you have developed any symptoms of COVID 19. If you develop any symptoms (ie: fever, flu-like symptoms, shortness of breath, cough etc.) before then, please call [phone].  If you test positive for Covid 19 in the 2 weeks post procedure, please call and report this information to [place].    If any biopsies were taken you will be contacted by phone or by letter within the next 1-3 weeks.  Please call us at [phone] if you have not heard about the biopsies in 3 weeks.    SIGNATURES/CONFIDENTIALITY: You and/or your care partner have signed paperwork which will be entered into your electronic medical record.  These signatures attest to the fact that that the information above on your After Visit Summary has been reviewed and is understood.  Full responsibility of the confidentiality of this discharge information lies with  you and/or your care-partner.

## 2019-09-02 NOTE — Progress Notes (Signed)
Report given to PACU, vss 

## 2019-09-02 NOTE — Op Note (Addendum)
[facility] Patient Name: Jenna Gardner Procedure Date: 09/02/2019 8:54 AM MRN: [id number] Endoscopist: Milus Banister , MD Age: 49 Referring MD:  Date of Birth: 1970-06-04 Gender: Female Account #: [id number] Procedure:                Colonoscopy Indications:              Screening for colorectal malignant neoplasm Medicines:                Monitored Anesthesia Care Procedure:                Pre-Anesthesia Assessment:                           - Prior to the procedure, a History and Physical                            was performed, and patient medications and                            allergies were reviewed. The patient's tolerance of                            previous anesthesia was also reviewed. The risks                            and benefits of the procedure and the sedation                            options and risks were discussed with the patient.                            All questions were answered, and informed consent                            was obtained. Prior Anticoagulants: The patient has                            taken no previous anticoagulant or antiplatelet                            agents. ASA Grade Assessment: II - A patient with                            mild systemic disease. After reviewing the risks                            and benefits, the patient was deemed in                            satisfactory condition to undergo the procedure.                           After obtaining informed consent, the colonoscope  was passed under direct vision. Throughout the                            procedure, the patient's blood pressure, pulse, and                            oxygen saturations were monitored continuously. The                            Colonoscope was introduced through the anus and                            advanced to the the cecum, identified by                            appendiceal orifice and  ileocecal valve. The                            colonoscopy was performed without difficulty. The                            patient tolerated the procedure well. The quality                            of the bowel preparation was good. The ileocecal                            valve, appendiceal orifice, and rectum were                            photographed. Scope In: 9:00:37 AM Scope Out: 9:13:05 AM Scope Withdrawal Time: 0 hours 9 minutes 58 seconds  Total Procedure Duration: 0 hours 12 minutes 28 seconds  Findings:                 A 2 mm polyp was found in the descending colon. The                            polyp was sessile. The polyp was removed with a                            cold snare. Resection and retrieval were complete.                           Medium sized external hemorrhoids.                           The exam was otherwise without abnormality on                            direct and retroflexion views. Complications:            No immediate complications. Estimated blood loss:  None. Estimated Blood Loss:     Estimated blood loss: none. Impression:               - One 2 mm polyp in the descending colon, removed                            with a cold snare. Resected and retrieved.                           - External hemorrhoids.                           - The examination was otherwise normal on direct                            and retroflexion views. Recommendation:           - Patient has a contact number available for                            emergencies. The signs and symptoms of potential                            delayed complications were discussed with the                            patient. Return to normal activities tomorrow.                            Written discharge instructions were provided to the                            patient.                           - Resume previous diet.                           - Continue  present medications.                           - Await pathology results. Milus Banister, MD 09/02/2019 9:14:39 AM This report has been signed electronically.

## 2019-09-06 ENCOUNTER — Telehealth: Payer: Self-pay

## 2019-09-06 NOTE — Telephone Encounter (Signed)
LVM

## 2019-09-06 NOTE — Telephone Encounter (Signed)
  Follow up Call-  Call back number 09/02/2019  Post procedure Call Back phone  # [phone]  Permission to leave phone message Yes  Some recent data might be hidden     Patient questions:  Do you have a fever, pain , or abdominal swelling? No. Pain Score  0 *  Have you tolerated food without any problems? Yes.    Have you been able to return to your normal activities? Yes.    Do you have any questions about your discharge instructions: Diet   No. Medications  No. Follow up visit  No.  Do you have questions or concerns about your Care? No.  Actions: * If pain score is 4 or above: No action needed, pain <4.  1. Have you developed a fever since your procedure? no  2.   Have you had an respiratory symptoms (SOB or cough) since your procedure? no  3.   Have you tested positive for COVID 19 since your procedure no  4.   Have you had any family members/close contacts diagnosed with the COVID 19 since your procedure?  no   If yes to any of these questions please route to Joylene John, RN and Erenest Rasher, RN

## 2019-09-07 ENCOUNTER — Encounter: Payer: Self-pay | Admitting: Gastroenterology

## 2019-12-06 IMAGING — MG DIGITAL SCREENING BILATERAL MAMMOGRAM WITH TOMO AND CAD
8 series · 8 of 24 positions shown · non-contrast
Comparison: Previous exam(s).

CLINICAL DATA: Screening.

EXAM:
DIGITAL SCREENING BILATERAL MAMMOGRAM WITH TOMO AND CAD

[L CC synth-2D]
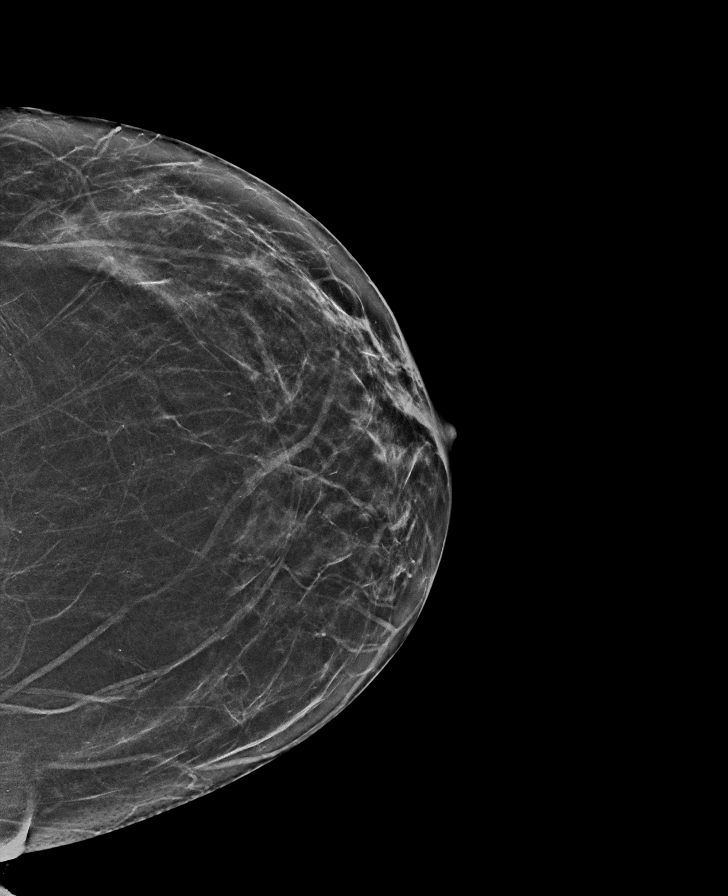

[R MLO synth-2D]
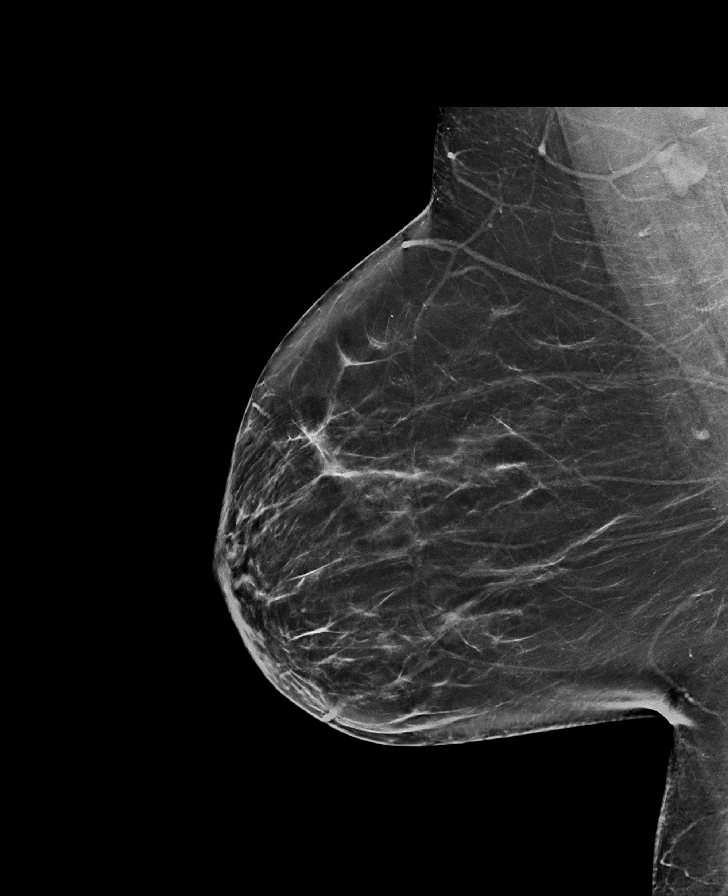

[L MLO synth-2D]
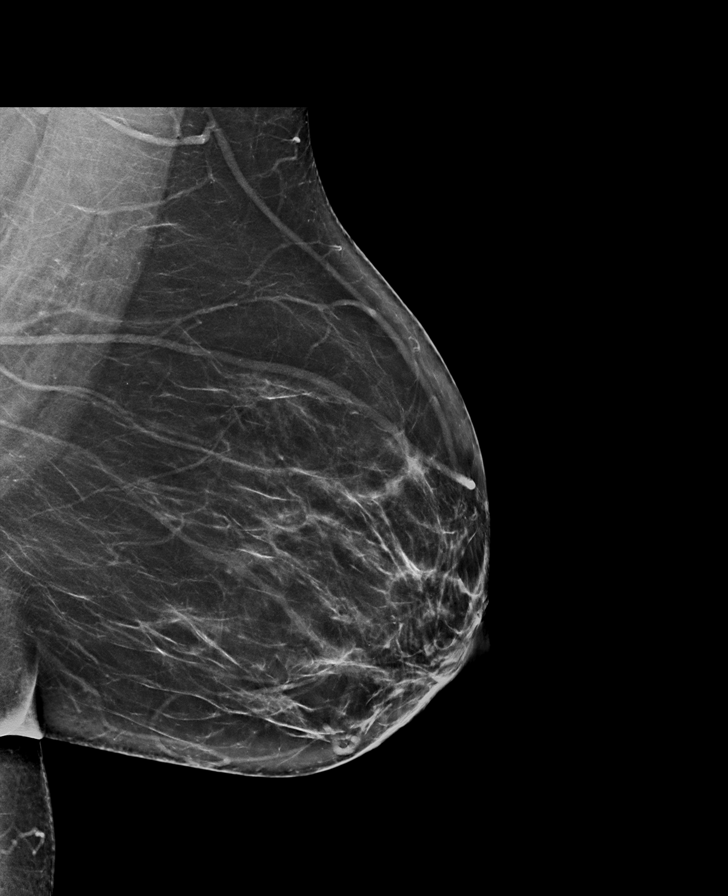

[R CC synth-2D]
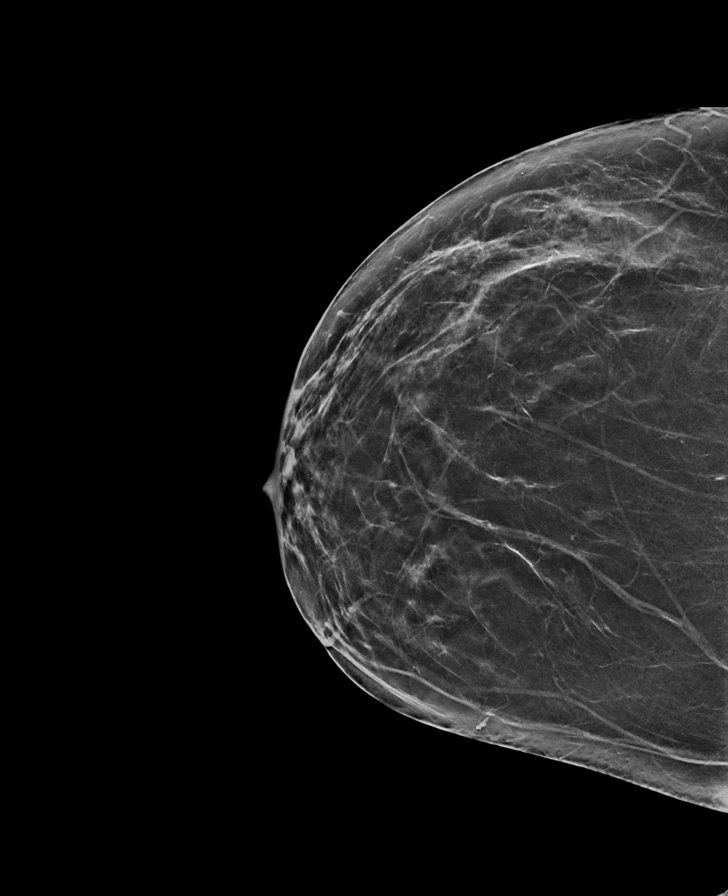

[R MLO tomo · tomo slice 39/77.0]
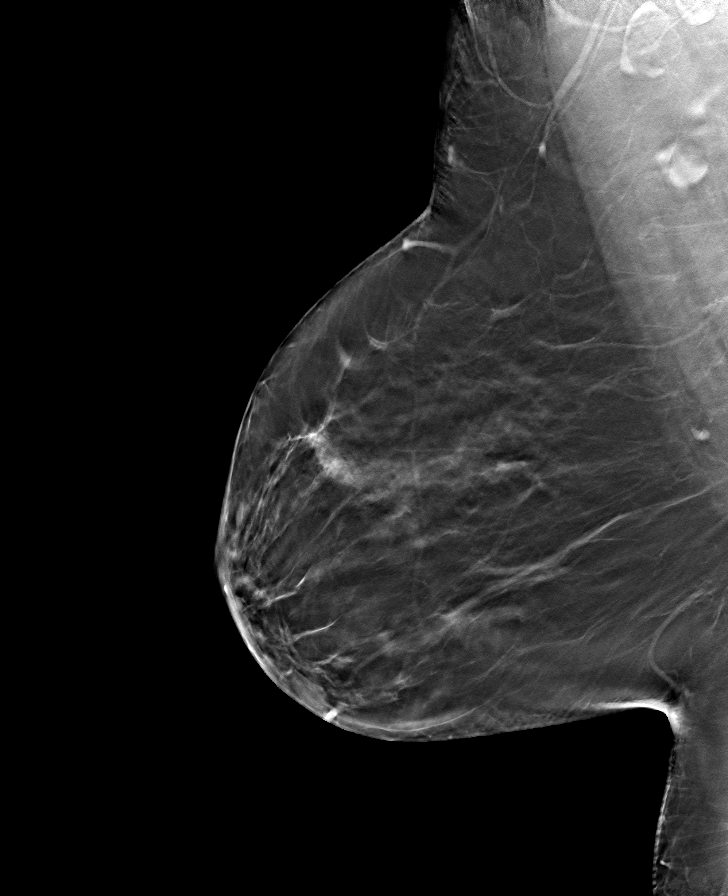

[L MLO tomo · tomo slice 40/79.0]
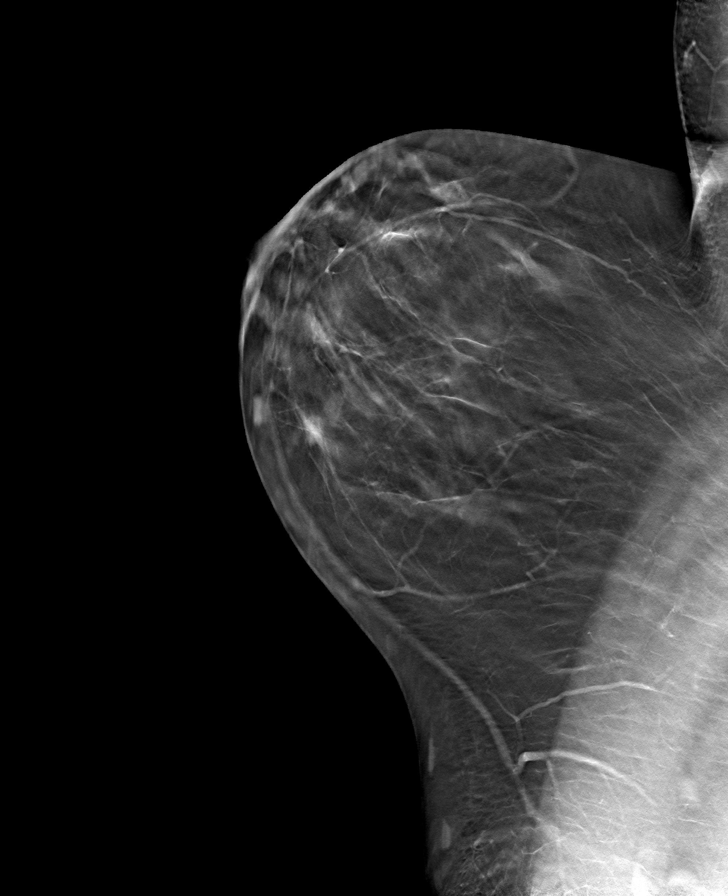

[L CC tomo · tomo slice 31/61.0]
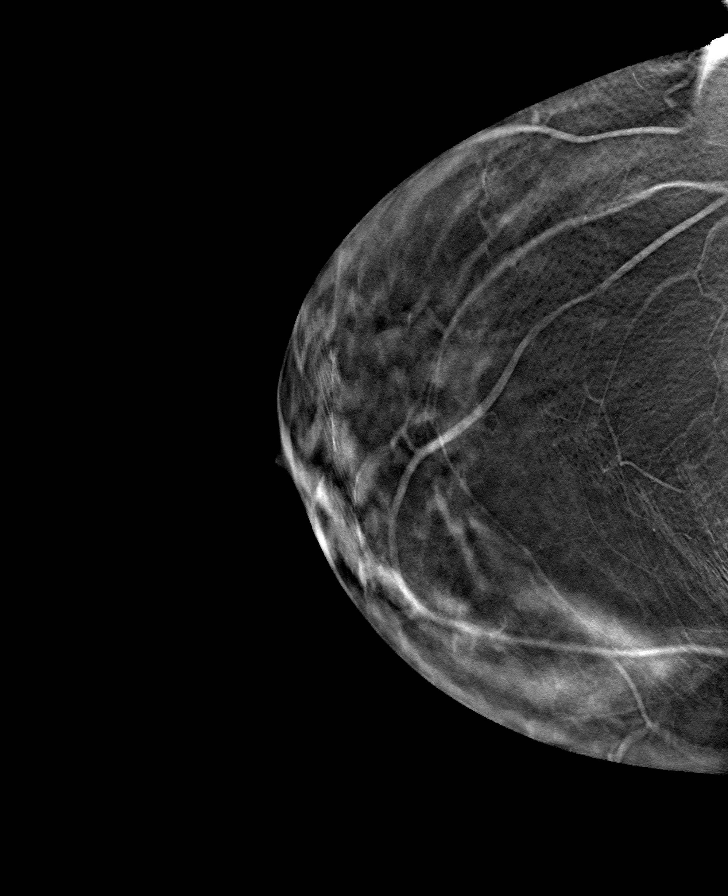

[R CC tomo · tomo slice 31/60.0]
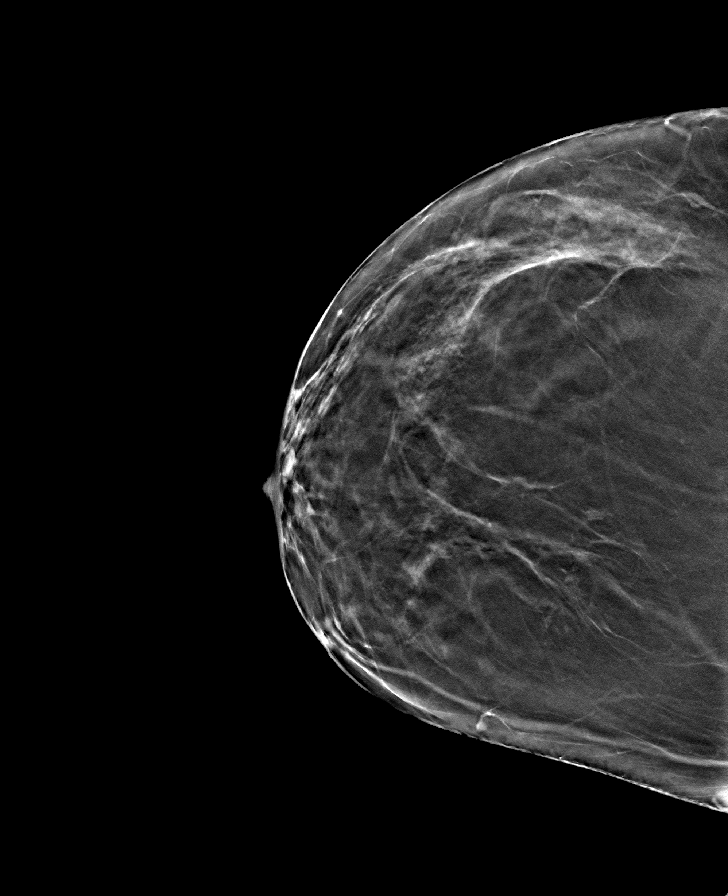

[8 of 24 positions shown; findings below may reference images not displayed]

ACR Breast Density Category b: There are scattered areas of
fibroglandular density.
FINDINGS: In the left breast, a possible mass warrants further evaluation. In
the right breast, no findings suspicious for malignancy.

Images were processed with CAD.
IMPRESSION: Further evaluation is suggested for possible mass in the left
breast.

RECOMMENDATION:
Diagnostic mammogram and possibly ultrasound of the left breast.
(Code:JC-2-SSL)

The patient will be contacted regarding the findings, and additional
imaging will be scheduled.

BI-RADS CATEGORY  0: Incomplete. Need additional imaging evaluation
and/or prior mammograms for comparison.

## 2019-12-26 ENCOUNTER — Other Ambulatory Visit (HOSPITAL_BASED_OUTPATIENT_CLINIC_OR_DEPARTMENT_OTHER): Payer: Self-pay | Admitting: Internal Medicine

## 2019-12-26 DIAGNOSIS — Z1231 Encounter for screening mammogram for malignant neoplasm of breast: Secondary | ICD-10-CM

## 2019-12-27 ENCOUNTER — Other Ambulatory Visit: Payer: Self-pay

## 2019-12-27 ENCOUNTER — Encounter: Payer: Self-pay | Admitting: Internal Medicine

## 2019-12-27 ENCOUNTER — Ambulatory Visit (HOSPITAL_BASED_OUTPATIENT_CLINIC_OR_DEPARTMENT_OTHER)
Admission: RE | Admit: 2019-12-27 | Discharge: 2019-12-27 | Disposition: A | Discharge status: Home / Self Care | Payer: No Typology Code available for payment source | Source: Ambulatory Visit | Attending: Internal Medicine | Admitting: Internal Medicine

## 2019-12-27 ENCOUNTER — Encounter (HOSPITAL_BASED_OUTPATIENT_CLINIC_OR_DEPARTMENT_OTHER): Payer: Self-pay

## 2019-12-27 ENCOUNTER — Ambulatory Visit (INDEPENDENT_AMBULATORY_CARE_PROVIDER_SITE_OTHER): Payer: No Typology Code available for payment source | Admitting: Internal Medicine

## 2019-12-27 VITALS — BP 130/85 | HR 71 | Temp 97.9°F | Resp 16 | Ht 66.0 in | Wt 213.4 lb

## 2019-12-27 DIAGNOSIS — R739 Hyperglycemia, unspecified: Secondary | ICD-10-CM | POA: Diagnosis not present

## 2019-12-27 DIAGNOSIS — Z1159 Encounter for screening for other viral diseases: Secondary | ICD-10-CM

## 2019-12-27 DIAGNOSIS — Z1231 Encounter for screening mammogram for malignant neoplasm of breast: Secondary | ICD-10-CM | POA: Diagnosis not present

## 2019-12-27 DIAGNOSIS — Z0000 Encounter for general adult medical examination without abnormal findings: Secondary | ICD-10-CM

## 2019-12-27 DIAGNOSIS — E538 Deficiency of other specified B group vitamins: Secondary | ICD-10-CM

## 2019-12-27 LAB — CBC WITH DIFFERENTIAL/PLATELET
Basophils Absolute: 0.1 10*3/uL (ref 0.0–0.1)
Basophils Relative: 0.8 % (ref 0.0–3.0)
Eosinophils Absolute: 0.2 10*3/uL (ref 0.0–0.7)
Eosinophils Relative: 2.6 % (ref 0.0–5.0)
HCT: 38.6 % (ref 36.0–46.0)
Hemoglobin: 12.7 g/dL (ref 12.0–15.0)
Lymphocytes Relative: 24.1 % (ref 12.0–46.0)
Lymphs Abs: 1.7 10*3/uL (ref 0.7–4.0)
MCHC: 32.9 g/dL (ref 30.0–36.0)
MCV: 89.2 fl (ref 78.0–100.0)
Monocytes Absolute: 0.5 10*3/uL (ref 0.1–1.0)
Monocytes Relative: 7.3 % (ref 3.0–12.0)
Neutro Abs: 4.7 10*3/uL (ref 1.4–7.7)
Neutrophils Relative %: 65.2 % (ref 43.0–77.0)
Platelets: 294 10*3/uL (ref 150.0–400.0)
RBC: 4.32 Mil/uL (ref 3.87–5.11)
RDW: 14.3 % (ref 11.5–15.5)
WBC: 7.2 10*3/uL (ref 4.0–10.5)

## 2019-12-27 LAB — VITAMIN B12: Vitamin B-12: 226 pg/mL (ref 211–911)

## 2019-12-27 LAB — COMPREHENSIVE METABOLIC PANEL
ALT: 12 U/L (ref 0–35)
AST: 13 U/L (ref 0–37)
Albumin: 4 g/dL (ref 3.5–5.2)
Alkaline Phosphatase: 66 U/L (ref 39–117)
BUN: 8 mg/dL (ref 6–23)
CO2: 24 mEq/L (ref 19–32)
Calcium: 9.4 mg/dL (ref 8.4–10.5)
Chloride: 105 mEq/L (ref 96–112)
Creatinine, Ser: 0.78 mg/dL (ref 0.40–1.20)
GFR: 78.01 mL/min (ref >60.00)
Glucose, Bld: 87 mg/dL (ref 70–99)
Potassium: 4.9 mEq/L (ref 3.5–5.1)
Sodium: 135 mEq/L (ref 135–145)
Total Bilirubin: 0.5 mg/dL (ref 0.2–1.2)
Total Protein: 6.7 g/dL (ref 6.0–8.3)

## 2019-12-27 LAB — LIPID PANEL
Cholesterol: 196 mg/dL (ref 0–200)
HDL: 74.4 mg/dL (ref >39.00)
LDL Cholesterol: 103 mg/dL — ABNORMAL HIGH (ref 0–99)
NonHDL: 122.08
Total CHOL/HDL Ratio: 3
Triglycerides: 93 mg/dL (ref 0.0–149.0)
VLDL: 18.6 mg/dL (ref 0.0–40.0)

## 2019-12-27 LAB — TSH: TSH: 0.68 u[IU]/mL (ref 0.35–4.50)

## 2019-12-27 NOTE — Assessment & Plan Note (Signed)
-  Td 09/2017 -Had Covid vaccinations - flu shot rec q. Year - Female care: per gyn, last Pap smear 08-2017 -FH breast cancer (aunt, 2 cousins): last  MMG -2020 --[facility]: Colonoscopy 08-2019, next 10 years per GI letter  --Diet, exercise: She is keeping her weight stable!.  Has been able to take walks daily -Labs: CMP, FLP, CBC, TSH, B12, hep C.

## 2019-12-27 NOTE — Patient Instructions (Signed)
Take a B12 oral supplement daily.  GO TO THE LAB : Get the blood work     [place], [place] Come back for a physical exam in 1 year

## 2019-12-27 NOTE — Progress Notes (Signed)
Pre visit review using our clinic review tool, if applicable. No additional management support is needed unless otherwise documented below in the visit note. 

## 2019-12-27 NOTE — Assessment & Plan Note (Signed)
Here for CPX B12 deficiency: Not taking supplements regularly, cost of injections is a issue.  We agreed to take a OTC B12 supplement daily, checking blood work today, consider recheck in 6 months. RTC 1 year

## 2019-12-27 NOTE — Progress Notes (Signed)
   Subjective:    Patient ID: Jenna Gardner, female    DOB: 06/05/1970, 49 y.o.   MRN: [id number]  DOS:  12/27/2019 Type of visit - description: CPX Since the last office visit a year ago she is doing well.  Wt Readings from Last 3 Encounters:  12/27/19 213 lb 6 oz (96.8 kg)  09/02/19 215 lb (97.5 kg)  08/19/19 215 lb (97.5 kg)     Review of Systems Admits to monthly menses, they are heavy.  Other than above, a 14 point review of systems is negative     Past Medical History:  Diagnosis Date  . Contraception    essure  . Thyromegaly    none    Past Surgical History:  Procedure Laterality Date  . CESAREAN SECTION     1996  . g3 p3    . TUBAL LIGATION     essure     Allergies as of 12/27/2019      Reactions   Penicillins Hives, Other (See Comments)   Childhood allergy   Sulfamethoxazole-trimethoprim Rash   Question af allergic reaction, see OV 07-15-2011  Question af allergic reaction, see OV 07-15-2011       Medication List    as of December 27, 2019  7:41 PM   You have not been prescribed any medications.        Objective:   Physical Exam BP 130/85 (BP Location: Left Arm, Patient Position: Sitting, Cuff Size: Normal)   Pulse 71   Temp 97.9 F (36.6 C) (Oral)   Resp 16   Ht 5\' 6"  (1.676 m)   Wt 213 lb 6 oz (96.8 kg)   LMP 12/08/2019   SpO2 97%   BMI 34.44 kg/m  General: Well developed, NAD, BMI noted Neck: No  thyromegaly  HEENT:  Normocephalic . Face symmetric, atraumatic Lungs:  CTA B Normal respiratory effort, no intercostal retractions, no accessory muscle use. Heart: RRR,  no murmur.  Abdomen:  Not distended, soft, non-tender. No rebound or rigidity.   Lower extremities: no pretibial edema bilaterally  Skin: Exposed areas without rash. Not pale. Not jaundice Neurologic:  alert & oriented X3.  Speech normal, gait appropriate for age and unassisted Strength symmetric and appropriate for age.  Psych: Cognition and judgment appear intact.   Cooperative with normal attention span and concentration.  Behavior appropriate. No anxious or depressed appearing.     Assessment     Assessment B12 deficiency H/o Tubal ligation Essure H/o thyromegaly - nl [place] 2014 FH CAD, admitted with chest pain 01-2014, stress test negative   PLAN: Here for CPX B12 deficiency: Not taking supplements regularly, cost of injections is a issue.  We agreed to take a OTC B12 supplement daily, checking blood work today, consider recheck in 6 months. RTC 1 year  This visit occurred during the SARS-CoV-2 public health emergency.  Safety protocols were in place, including screening questions prior to the visit, additional usage of staff PPE, and extensive cleaning of exam room while observing appropriate contact time as indicated for disinfecting solutions.

## 2019-12-28 LAB — HEPATITIS C ANTIBODY
Hepatitis C Ab: NONREACTIVE
SIGNAL TO CUT-OFF: 0.02 (ref <1.00)

## 2020-11-28 ENCOUNTER — Other Ambulatory Visit (HOSPITAL_BASED_OUTPATIENT_CLINIC_OR_DEPARTMENT_OTHER): Payer: Self-pay | Admitting: Obstetrics and Gynecology

## 2020-11-28 DIAGNOSIS — Z1231 Encounter for screening mammogram for malignant neoplasm of breast: Secondary | ICD-10-CM

## 2020-12-26 ENCOUNTER — Ambulatory Visit (INDEPENDENT_AMBULATORY_CARE_PROVIDER_SITE_OTHER): Payer: No Typology Code available for payment source | Admitting: Internal Medicine

## 2020-12-26 ENCOUNTER — Other Ambulatory Visit: Payer: Self-pay

## 2020-12-26 ENCOUNTER — Encounter: Payer: Self-pay | Admitting: Internal Medicine

## 2020-12-26 VITALS — BP 126/80 | HR 87 | Temp 97.8°F | Resp 16 | Ht 66.0 in | Wt 223.1 lb

## 2020-12-26 DIAGNOSIS — Z0000 Encounter for general adult medical examination without abnormal findings: Secondary | ICD-10-CM

## 2020-12-26 DIAGNOSIS — E538 Deficiency of other specified B group vitamins: Secondary | ICD-10-CM | POA: Diagnosis not present

## 2020-12-26 LAB — COMPREHENSIVE METABOLIC PANEL
ALT: 15 U/L (ref 0–35)
AST: 14 U/L (ref 0–37)
Albumin: 3.9 g/dL (ref 3.5–5.2)
Alkaline Phosphatase: 69 U/L (ref 39–117)
BUN: 9 mg/dL (ref 6–23)
CO2: 25 mEq/L (ref 19–32)
Calcium: 9.5 mg/dL (ref 8.4–10.5)
Chloride: 102 mEq/L (ref 96–112)
Creatinine, Ser: 0.82 mg/dL (ref 0.40–1.20)
GFR: 82.76 mL/min (ref >60.00)
Glucose, Bld: 91 mg/dL (ref 70–99)
Potassium: 4.6 mEq/L (ref 3.5–5.1)
Sodium: 136 mEq/L (ref 135–145)
Total Bilirubin: 0.5 mg/dL (ref 0.2–1.2)
Total Protein: 6.6 g/dL (ref 6.0–8.3)

## 2020-12-26 LAB — LIPID PANEL
Cholesterol: 204 mg/dL — ABNORMAL HIGH (ref 0–200)
HDL: 67.4 mg/dL (ref >39.00)
LDL Cholesterol: 119 mg/dL — ABNORMAL HIGH (ref 0–99)
NonHDL: 136.5
Total CHOL/HDL Ratio: 3
Triglycerides: 88 mg/dL (ref 0.0–149.0)
VLDL: 17.6 mg/dL (ref 0.0–40.0)

## 2020-12-26 LAB — TSH: TSH: 0.7 u[IU]/mL (ref 0.35–5.50)

## 2020-12-26 LAB — B12 AND FOLATE PANEL
Folate: 11.2 ng/mL (ref >5.9)
Vitamin B-12: 200 pg/mL — ABNORMAL LOW (ref 211–911)

## 2020-12-26 NOTE — Progress Notes (Signed)
Subjective:    Patient ID: Jenna Gardner, female    DOB: 08/29/1970, 50 y.o.   MRN: [id number]  DOS:  12/26/2020 Type of visit - description: CPX  Since the last office visit is doing well. She did notice some weight gain since she went back to work at the office rather than from home.  Wt Readings from Last 3 Encounters:  12/26/20 223 lb 2 oz (101.2 kg)  12/27/19 213 lb 6 oz (96.8 kg)  09/02/19 215 lb (97.5 kg)   Review of Systems  Other than above, a 14 point review of systems is negative       Past Medical History:  Diagnosis Date   Contraception    essure   Thyromegaly    none    Past Surgical History:  Procedure Laterality Date   CESAREAN SECTION     1996   g3 p3     TUBAL LIGATION     essure    Social History   Socioeconomic History   Marital status: Married    Spouse name: Christy Sartorius   Number of children: 3   Years of education: Not on file   Highest education level: Not on file  Occupational History   Occupation: Merchandiser, retail  of Engineer, maintenance (IT): [employer]    Comment: Government social research officer  Tobacco Use   Smoking status: Never   Smokeless tobacco: Never  Vaping Use   Vaping Use: Never used  Substance and Sexual Activity   Alcohol use: Yes    Alcohol/week: 6.0 standard drinks    Types: 6 Cans of beer per week   Drug use: No   Sexual activity: Yes    Birth control/protection: None  Other Topics Concern   Not on file  Social History Narrative   Household:  pt, husband, 1 child   Mother is in [place] at a memory unit, late stage alzheimer        Social Determinants of Health   Financial Resource Strain: Not on file  Food Insecurity: Not on file  Transportation Needs: Not on file  Physical Activity: Not on file  Stress: Not on file  Social Connections: Not on file  Intimate Partner Violence: Not on file     Allergies as of 12/26/2020       Reactions   Penicillins Hives, Other (See Comments)   Childhood allergy    Sulfamethoxazole-trimethoprim Rash   Question af allergic reaction, see OV 07-15-2011  Question af allergic reaction, see OV 07-15-2011         Medication List        Accurate as of December 26, 2020 11:59 PM. If you have any questions, ask your nurse or doctor.          vitamin B-12 1000 MCG tablet Commonly known as: CYANOCOBALAMIN Take 1,000 mcg by mouth daily.           Objective:   Physical Exam BP 126/80 (BP Location: Left Arm, Patient Position: Sitting, Cuff Size: Normal)   Pulse 87   Temp 97.8 F (36.6 C) (Oral)   Resp 16   Ht '5\' 6"'$  (1.676 m)   Wt 223 lb 2 oz (101.2 kg)   LMP 12/24/2020 (Exact Date)   SpO2 98%   BMI 36.01 kg/m  General: Well developed, NAD, BMI noted Neck: No  thyromegaly  HEENT:  Normocephalic . Face symmetric, atraumatic Lungs:  CTA B Normal respiratory effort, no intercostal retractions, no accessory muscle use.  Heart: RRR,  no murmur.  Abdomen:  Not distended, soft, non-tender. No rebound or rigidity.   Lower extremities: no pretibial edema bilaterally  Skin: Exposed areas without rash. Not pale. Not jaundice Neurologic:  alert & oriented X3.  Speech normal, gait appropriate for age and unassisted Strength symmetric and appropriate for age.  Psych: Cognition and judgment appear intact.  Cooperative with normal attention span and concentration.  Behavior appropriate. No anxious or depressed appearing.     Assessment     Assessment B12 deficiency H/o Tubal ligation Essure H/o thyromegaly - nl [place] 2014 FH CAD, admitted with chest pain 01-2014, stress test negative   PLAN: Here for CPX B12 deficiency: Not taking supplements regularly, encouraged to do, checking levels RTC 1 year   This visit occurred during the SARS-CoV-2 public health emergency.  Safety protocols were in place, including screening questions prior to the visit, additional usage of staff PPE, and extensive cleaning of exam room while observing appropriate  contact time as indicated for disinfecting solutions.

## 2020-12-26 NOTE — Patient Instructions (Signed)
   GO TO THE LAB : Get the blood work     GO TO THE FRONT DESK, PLEASE SCHEDULE YOUR APPOINTMENTS Come back for a physical exam in 1 year 

## 2020-12-27 ENCOUNTER — Encounter: Payer: Self-pay | Admitting: Internal Medicine

## 2020-12-27 NOTE — Assessment & Plan Note (Signed)
Here for CPX B12 deficiency: Not taking supplements regularly, encouraged to do, checking levels RTC 1 year

## 2020-12-27 NOTE — Assessment & Plan Note (Signed)
-  Td 09/2017 - shingrix d/w pt  -Had Covid vax x 3, plans to get a booster this year - flu shot rec q. year - Female care: per gyn, last Pap smear 08-2017, plans to call Gyn -FH breast cancer (aunt, 2 cousins): last  MMG 12/2019 --CCS: Colonoscopy 08-2019, next 10 years per GI letter --Diet used to be better when she was working virtually from home, has gained some weight now that she is back in the office, encouraged healthy diet.  She does take walks daily. - Labs: CMP, FLP, TSH, B12.

## 2021-01-01 ENCOUNTER — Ambulatory Visit (HOSPITAL_BASED_OUTPATIENT_CLINIC_OR_DEPARTMENT_OTHER)
Admission: RE | Admit: 2021-01-01 | Discharge: 2021-01-01 | Disposition: A | Discharge status: Home / Self Care | Payer: No Typology Code available for payment source | Source: Ambulatory Visit | Attending: Obstetrics and Gynecology | Admitting: Obstetrics and Gynecology

## 2021-01-01 ENCOUNTER — Other Ambulatory Visit: Payer: Self-pay

## 2021-01-01 ENCOUNTER — Encounter (HOSPITAL_BASED_OUTPATIENT_CLINIC_OR_DEPARTMENT_OTHER): Payer: Self-pay

## 2021-01-01 DIAGNOSIS — Z1231 Encounter for screening mammogram for malignant neoplasm of breast: Secondary | ICD-10-CM | POA: Diagnosis present

## 2021-04-24 ENCOUNTER — Ambulatory Visit (INDEPENDENT_AMBULATORY_CARE_PROVIDER_SITE_OTHER): Payer: No Typology Code available for payment source

## 2021-04-24 ENCOUNTER — Other Ambulatory Visit: Payer: Self-pay

## 2021-04-24 DIAGNOSIS — Z23 Encounter for immunization: Secondary | ICD-10-CM | POA: Diagnosis not present

## 2021-10-14 ENCOUNTER — Encounter: Payer: Self-pay | Admitting: Internal Medicine

## 2022-03-06 ENCOUNTER — Encounter: Payer: No Typology Code available for payment source | Admitting: Internal Medicine

## 2022-04-10 ENCOUNTER — Encounter: Payer: Self-pay | Admitting: Internal Medicine

## 2022-04-14 ENCOUNTER — Other Ambulatory Visit (HOSPITAL_BASED_OUTPATIENT_CLINIC_OR_DEPARTMENT_OTHER): Payer: Self-pay | Admitting: Obstetrics and Gynecology

## 2022-04-14 DIAGNOSIS — Z1231 Encounter for screening mammogram for malignant neoplasm of breast: Secondary | ICD-10-CM

## 2022-06-03 ENCOUNTER — Encounter: Payer: Self-pay | Admitting: Internal Medicine

## 2022-06-04 ENCOUNTER — Encounter: Payer: Self-pay | Admitting: Internal Medicine

## 2022-06-04 ENCOUNTER — Ambulatory Visit (HOSPITAL_BASED_OUTPATIENT_CLINIC_OR_DEPARTMENT_OTHER)
Admission: RE | Admit: 2022-06-04 | Discharge: 2022-06-04 | Disposition: A | Discharge status: Home / Self Care | Payer: 59 | Source: Ambulatory Visit | Attending: Obstetrics and Gynecology | Admitting: Obstetrics and Gynecology

## 2022-06-04 ENCOUNTER — Encounter (HOSPITAL_BASED_OUTPATIENT_CLINIC_OR_DEPARTMENT_OTHER): Payer: Self-pay

## 2022-06-04 ENCOUNTER — Ambulatory Visit (INDEPENDENT_AMBULATORY_CARE_PROVIDER_SITE_OTHER): Payer: 59 | Admitting: Internal Medicine

## 2022-06-04 VITALS — BP 122/76 | HR 76 | Temp 97.9°F | Resp 18 | Ht 66.0 in | Wt 226.4 lb

## 2022-06-04 DIAGNOSIS — E538 Deficiency of other specified B group vitamins: Secondary | ICD-10-CM | POA: Diagnosis not present

## 2022-06-04 DIAGNOSIS — Z1231 Encounter for screening mammogram for malignant neoplasm of breast: Secondary | ICD-10-CM

## 2022-06-04 DIAGNOSIS — Z8249 Family history of ischemic heart disease and other diseases of the circulatory system: Secondary | ICD-10-CM

## 2022-06-04 DIAGNOSIS — Z0000 Encounter for general adult medical examination without abnormal findings: Secondary | ICD-10-CM | POA: Diagnosis not present

## 2022-06-04 NOTE — Progress Notes (Signed)
   Subjective:    Patient ID: Jenna Gardner, female    DOB: 27-Jan-1971, 52 y.o.   MRN: [id number]  DOS:  06/04/2022 Type of visit - description: CPX  Since the last office visit is doing well and has no concerns  Review of Systems    A 14 point review of systems is negative    Past Medical History:  Diagnosis Date   Contraception    essure   Thyromegaly    none    Past Surgical History:  Procedure Laterality Date   CESAREAN SECTION     1996   g3 p3     TUBAL LIGATION     essure    Social History   Socioeconomic History   Marital status: Married    Spouse name: Christy Sartorius   Number of children: 3   Years of education: Not on file   Highest education level: Not on file  Occupational History   Occupation: bank  of Engineer, 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  Tobacco Use   Smoking status: Never   Smokeless tobacco: Never  Vaping Use   Vaping Use: Never used  Substance and Sexual Activity   Alcohol use: Yes    Alcohol/week: 6.0 standard drinks of alcohol    Types: 6 Cans of beer per week   Drug use: No   Sexual activity: Yes    Birth control/protection: None  Other Topics Concern   Not on file  Social History Narrative   Household:  pt, husband, 1 child   Mother is in [place] at a memory unit, late stage alzheimer        Social Determinants of Health   Financial Resource Strain: Not on file  Food Insecurity: Not on file  Transportation Needs: Not on file  Physical Activity: Not on file  Stress: Not on file  Social Connections: Not on file  Intimate Partner Violence: Not on file     Current Outpatient Medications  Medication Instructions   cyanocobalamin (VITAMIN B12) 1,000 mcg, Oral, Daily       Objective:   Physical Exam BP 122/76   Pulse 76   Temp 97.9 F (36.6 C) (Oral)   Resp 18   Ht '5\' 6"'$  (1.676 m)   Wt 226 lb 6 oz (102.7 kg)   LMP 05/24/2022   SpO2 97%   BMI 36.54 kg/m  General: Well developed, NAD, BMI  noted Neck: No  thyromegaly  HEENT:  Normocephalic . Face symmetric, atraumatic Lungs:  CTA B Normal respiratory effort, no intercostal retractions, no accessory muscle use. Heart: RRR,  no murmur.  Abdomen:  Not distended, soft, non-tender. No rebound or rigidity.   Lower extremities: no pretibial edema bilaterally  Skin: Exposed areas without rash. Not pale. Not jaundice Neurologic:  alert & oriented X3.  Speech normal, gait appropriate for age and unassisted Strength symmetric and appropriate for age.  Psych: Cognition and judgment appear intact.  Cooperative with normal attention span and concentration.  Behavior appropriate. No anxious or depressed appearing.     Assessment     Assessment B12 deficiency H/o Tubal ligation Essure H/o thyromegaly - nl [place] 2014 FH CAD, admitted with chest pain 01-2014, stress test negative   PLAN: Here for CPX B12 deficiency: On oral supplements, checking levels, she will be somewhat reluctant to take injections. FH CAD: Benefits of calcium coronary score discussed, will proceed. RTC 1 year

## 2022-06-04 NOTE — Assessment & Plan Note (Signed)
Here for CPX B12 deficiency: On oral supplements, checking levels, she will be somewhat reluctant to take injections. FH CAD: Benefits of calcium coronary score discussed, will proceed. RTC 1 year

## 2022-06-04 NOTE — Patient Instructions (Addendum)
Will schedule calcium coronary score, if you do not hear from them in the next few days please let us know  GO TO THE LAB : Get the blood work     [place], [place] back at your earliest convenience for blood work.  Come back for physical exam in 1 year

## 2022-06-04 NOTE — Assessment & Plan Note (Signed)
-   Td 09/2017 - shingrix d/w pt  - Covid vax utd - flu shot rec q. year - Female care: per gyn  -FH breast cancer (aunt, 2 cousins): last  MMG today, results pending  --CCS: Colonoscopy 08-2019, next 10 years per GI letter --Diet exercise: Discussed - Labs CMP FLP CBC B12

## 2022-06-05 ENCOUNTER — Other Ambulatory Visit (INDEPENDENT_AMBULATORY_CARE_PROVIDER_SITE_OTHER): Payer: 59

## 2022-06-05 DIAGNOSIS — Z0000 Encounter for general adult medical examination without abnormal findings: Secondary | ICD-10-CM

## 2022-06-05 DIAGNOSIS — E538 Deficiency of other specified B group vitamins: Secondary | ICD-10-CM | POA: Diagnosis not present

## 2022-06-05 DIAGNOSIS — D509 Iron deficiency anemia, unspecified: Secondary | ICD-10-CM

## 2022-06-05 LAB — CBC WITH DIFFERENTIAL/PLATELET
Absolute Monocytes: 462 cells/uL (ref 200–950)
Basophils Absolute: 48 cells/uL (ref 0–200)
Basophils Relative: 0.7 %
Eosinophils Absolute: 190 cells/uL (ref 15–500)
Eosinophils Relative: 2.8 %
HCT: 33.7 % — ABNORMAL LOW (ref 35.0–45.0)
Hemoglobin: 10.8 g/dL — ABNORMAL LOW (ref 11.7–15.5)
Lymphs Abs: 2060 cells/uL (ref 850–3900)
MCH: 25.8 pg — ABNORMAL LOW (ref 27.0–33.0)
MCHC: 32 g/dL (ref 32.0–36.0)
MCV: 80.6 fL (ref 80.0–100.0)
MPV: 9.2 fL (ref 7.5–12.5)
Monocytes Relative: 6.8 %
Neutro Abs: 4039 cells/uL (ref 1500–7800)
Neutrophils Relative %: 59.4 %
Platelets: 383 10*3/uL (ref 140–400)
RBC: 4.18 10*6/uL (ref 3.80–5.10)
RDW: 14.1 % (ref 11.0–15.0)
Total Lymphocyte: 30.3 %
WBC: 6.8 10*3/uL (ref 3.8–10.8)

## 2022-06-05 NOTE — Addendum Note (Signed)
Addended by: Manuela Schwartz on: 06/05/2022 09:39 AM   Modules accepted: Orders

## 2022-06-05 NOTE — Addendum Note (Signed)
Addended by: Manuela Schwartz on: 06/05/2022 09:37 AM   Modules accepted: Orders

## 2022-06-05 NOTE — Addendum Note (Signed)
Addended by: Manuela Schwartz on: 06/05/2022 09:36 AM   Modules accepted: Orders

## 2022-06-06 ENCOUNTER — Other Ambulatory Visit: Payer: Self-pay

## 2022-06-06 ENCOUNTER — Telehealth: Payer: Self-pay

## 2022-06-06 ENCOUNTER — Other Ambulatory Visit: Payer: Self-pay | Admitting: *Deleted

## 2022-06-06 DIAGNOSIS — D649 Anemia, unspecified: Secondary | ICD-10-CM

## 2022-06-06 NOTE — Telephone Encounter (Signed)
Physical form completed and faxed back to [facility] at [phone]. Form sent for scanning.

## 2022-06-06 NOTE — Addendum Note (Signed)
Addended byDamita Dunnings D on: 06/06/2022 08:20 AM   Modules accepted: Orders

## 2022-06-06 NOTE — Telephone Encounter (Signed)
Fax confirmation received.

## 2022-06-08 LAB — LIPID PANEL
Cholesterol: 208 mg/dL — ABNORMAL HIGH (ref <200)
HDL: 70 mg/dL (ref >=50)
LDL Cholesterol (Calc): 117 mg/dL (calc) — ABNORMAL HIGH
Non-HDL Cholesterol (Calc): 138 mg/dL (calc) — ABNORMAL HIGH (ref <130)
Total CHOL/HDL Ratio: 3 (calc) (ref <5.0)
Triglycerides: 104 mg/dL (ref <150)

## 2022-06-08 LAB — COMPREHENSIVE METABOLIC PANEL
AG Ratio: 1.5 (calc) (ref 1.0–2.5)
ALT: 15 U/L (ref 6–29)
AST: 14 U/L (ref 10–35)
Albumin: 4 g/dL (ref 3.6–5.1)
Alkaline phosphatase (APISO): 76 U/L (ref 37–153)
BUN: 8 mg/dL (ref 7–25)
CO2: 22 mmol/L (ref 20–32)
Calcium: 9.4 mg/dL (ref 8.6–10.4)
Chloride: 104 mmol/L (ref 98–110)
Creat: 0.76 mg/dL (ref 0.50–1.03)
Globulin: 2.7 g/dL (calc) (ref 1.9–3.7)
Glucose, Bld: 98 mg/dL (ref 65–99)
Potassium: 4.7 mmol/L (ref 3.5–5.3)
Sodium: 136 mmol/L (ref 135–146)
Total Bilirubin: 0.4 mg/dL (ref 0.2–1.2)
Total Protein: 6.7 g/dL (ref 6.1–8.1)

## 2022-06-08 LAB — B12 AND FOLATE PANEL
Folate: 12 ng/mL
Vitamin B-12: 366 pg/mL (ref 200–1100)

## 2022-06-08 LAB — IRON: Iron: 35 ug/dL — ABNORMAL LOW (ref 45–160)

## 2022-06-08 LAB — FERRITIN: Ferritin: 4 ng/mL — ABNORMAL LOW (ref 16–232)

## 2022-06-13 ENCOUNTER — Other Ambulatory Visit (HOSPITAL_BASED_OUTPATIENT_CLINIC_OR_DEPARTMENT_OTHER): Payer: 59

## 2022-06-13 MED ORDER — FERROUS FUMARATE 325 (106 FE) MG PO TABS: 1.0000 | ORAL_TABLET | Freq: Two times a day (BID) | ORAL | 6 refills | Status: AC

## 2022-06-13 NOTE — Addendum Note (Signed)
Addended byDamita Dunnings D on: 06/13/2022 12:50 PM   Modules accepted: Orders

## 2022-06-16 ENCOUNTER — Ambulatory Visit (HOSPITAL_BASED_OUTPATIENT_CLINIC_OR_DEPARTMENT_OTHER)
Admission: RE | Admit: 2022-06-16 | Discharge: 2022-06-16 | Disposition: A | Discharge status: Home / Self Care | Payer: 59 | Source: Ambulatory Visit | Attending: Internal Medicine | Admitting: Internal Medicine

## 2022-06-16 DIAGNOSIS — Z8249 Family history of ischemic heart disease and other diseases of the circulatory system: Secondary | ICD-10-CM | POA: Insufficient documentation

## 2022-09-08 ENCOUNTER — Encounter: Payer: Self-pay | Admitting: Internal Medicine

## 2023-01-13 LAB — HM PAP SMEAR

## 2023-01-13 LAB — RESULTS CONSOLE HPV: CHL HPV: NEGATIVE

## 2023-06-05 ENCOUNTER — Encounter: Payer: Self-pay | Admitting: Internal Medicine

## 2023-06-08 ENCOUNTER — Ambulatory Visit (INDEPENDENT_AMBULATORY_CARE_PROVIDER_SITE_OTHER): Payer: 59 | Admitting: Internal Medicine

## 2023-06-08 ENCOUNTER — Encounter: Payer: Self-pay | Admitting: Internal Medicine

## 2023-06-08 VITALS — BP 126/80 | HR 78 | Temp 98.0°F | Resp 16 | Ht 66.0 in | Wt 220.4 lb

## 2023-06-08 DIAGNOSIS — D649 Anemia, unspecified: Secondary | ICD-10-CM | POA: Diagnosis not present

## 2023-06-08 DIAGNOSIS — E538 Deficiency of other specified B group vitamins: Secondary | ICD-10-CM | POA: Diagnosis not present

## 2023-06-08 DIAGNOSIS — Z0000 Encounter for general adult medical examination without abnormal findings: Secondary | ICD-10-CM | POA: Diagnosis not present

## 2023-06-08 DIAGNOSIS — Z1231 Encounter for screening mammogram for malignant neoplasm of breast: Secondary | ICD-10-CM | POA: Diagnosis not present

## 2023-06-08 NOTE — Assessment & Plan Note (Signed)
 Here for CPX  B12 deficiency: On supplements, checking labs IDA: History of iron deficiency, the patient has very heavy menses monthly, also she donates blood quarterly .  In light of these,the patient decided not to pursue the GI referral I recommended last year.  Plan: Continue iron supplements, check labs, also recommend not to donate blood every 3 months as she is doing.  Once or twice a year?. FH CAD: no sxs, neg  calcium coronary score last year. RTC 1 year

## 2023-06-08 NOTE — Patient Instructions (Addendum)
 Vaccines I recommend: Shingrix (shingles)  Come back for blood work at your earliest convenience  Next visit with me in 1 year     Please schedule it at the front desk     Health Care Power of attorney ,  Living will (Advance care planning documents)  If you already have a living will or healthcare power of attorney, is recommended you bring the copy to be scanned in your chart.   The document will be available to all the doctors you see in the system.  Advance care planning is a process that supports adults in  understanding and sharing their preferences regarding future medical care.  The patient's preferences are recorded in documents called Advance Directives and the can be modified at any time while the patient is in full mental capacity.   If you don't have one, please consider create one.      More information at: [email or web address]

## 2023-06-08 NOTE — Assessment & Plan Note (Signed)
 Here for CPX - Td 09/2017 - shingrix d/w pt  - had a flu shot and Covid vax recently  - Female care: per gyn  -FH breast cancer (aunt, 2 cousins): plans to proceed w/ a MMG soon --CCS: Colonoscopy 08-2019, next 10 years per GI letter --Diet exercise: Discussed - Labs CMP FLP CBC, anemia panel, B12

## 2023-06-08 NOTE — Progress Notes (Signed)
 Subjective:    Patient ID: Jenna Gardner, female    DOB: 1970/10/04, 53 y.o.   MRN: [id number]  DOS:  06/08/2023 Type of visit - description: CPX  In general feels well. Has a history of iron deficiency. Reports she has monthly periods, last ~  3 days, they are extremely heavy. Also she is a routine blood donor. Specifically denies nausea vomiting.  No diarrhea.  No change in her BM  pattern.   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  Tobacco Use   Smoking status: Never   Smokeless tobacco: Never  Vaping Use   Vaping status: Never Used  Substance and Sexual Activity   Alcohol use: Yes    Alcohol/week: 6.0 standard drinks of alcohol    Types: 6 Cans of beer per week   Drug use: No   Sexual activity: Yes    Birth control/protection: None  Other Topics Concern   Not on file  Social History Narrative   Household:  pt, husband        Social Drivers of Corporate Investment Banker Strain: Not on file  Food Insecurity: Not on file  Transportation Needs: Not on file  Physical Activity: Not on file  Stress: Not on file  Social Connections: Not on file  Intimate Partner Violence: Not on file     Current Outpatient Medications  Medication Instructions   cyanocobalamin  (VITAMIN B12) 1,000 mcg, Daily   ferrous fumarate  (HEMOCYTE - 106 MG FE) 325 (106 Fe) MG TABS tablet 106 mg of iron, Oral, 2 times daily        Objective:   Physical Exam BP 126/80   Pulse 78   Temp 98 F (36.7 C) (Oral)   Resp 16   Ht 5' 6 (1.676 m)   Wt 220 lb 6 oz (100 kg)   LMP 05/19/2023 (Exact Date)   SpO2 97%   BMI 35.57 kg/m  General:   Well developed, NAD, BMI noted.  HEENT:  Normocephalic . Face symmetric, atraumatic Lungs:  CTA B Normal respiratory effort, no intercostal retractions, no accessory muscle use. Heart: RRR,  no murmur.  Abdomen:  Not distended, soft, non-tender. No rebound or rigidity.   Skin: Not pale. Not jaundice Lower extremities: no pretibial edema bilaterally  Neurologic:  alert & oriented X3.  Speech normal, gait appropriate for age and unassisted Psych--  Cognition and judgment appear intact.  Cooperative with normal attention span and concentration.  Behavior appropriate. No anxious or depressed appearing.     Assessment    Assessment B12 deficiency Iron deficiency anemia H/o Tubal ligation Essure H/o thyromegaly - nl [place]  2014 FH CAD----2015, stress test negative, calcium coronary score 05/2022: (-)  PLAN: Here for  CPX - Td 09/2017 - shingrix d/w pt  - had a flu shot and Covid vax recently  - Female care: per gyn  -FH breast cancer (aunt, 2 cousins): plans to proceed w/ a MMG soon --CCS: Colonoscopy 08-2019, next 10 years per GI letter --Diet exercise: Discussed - Labs CMP FLP CBC, anemia panel, B12 B12 deficiency: On supplements, checking labs IDA: History of iron deficiency, the patient has very heavy menses monthly, also she donates blood quarterly .  In light of these,the patient decided not to pursue the GI referral I recommended last year.  Plan: Continue iron supplements, check labs, also recommend not to donate blood every 3 months as she is doing.  Once or twice a year?. FH CAD: no sxs, neg  calcium coronary score last year. RTC 1 year

## 2023-06-10 ENCOUNTER — Other Ambulatory Visit (INDEPENDENT_AMBULATORY_CARE_PROVIDER_SITE_OTHER): Payer: 59

## 2023-06-10 ENCOUNTER — Encounter (HOSPITAL_BASED_OUTPATIENT_CLINIC_OR_DEPARTMENT_OTHER): Payer: Self-pay

## 2023-06-10 ENCOUNTER — Ambulatory Visit (HOSPITAL_BASED_OUTPATIENT_CLINIC_OR_DEPARTMENT_OTHER)
Admission: RE | Admit: 2023-06-10 | Discharge: 2023-06-10 | Disposition: A | Discharge status: Home / Self Care | Payer: 59 | Source: Ambulatory Visit | Attending: Internal Medicine | Admitting: Internal Medicine

## 2023-06-10 DIAGNOSIS — Z1231 Encounter for screening mammogram for malignant neoplasm of breast: Secondary | ICD-10-CM | POA: Insufficient documentation

## 2023-06-10 DIAGNOSIS — E538 Deficiency of other specified B group vitamins: Secondary | ICD-10-CM | POA: Diagnosis not present

## 2023-06-10 DIAGNOSIS — D649 Anemia, unspecified: Secondary | ICD-10-CM

## 2023-06-10 DIAGNOSIS — Z0000 Encounter for general adult medical examination without abnormal findings: Secondary | ICD-10-CM

## 2023-06-10 NOTE — Patient Instructions (Signed)
 Jenna Gardner

## 2023-06-11 ENCOUNTER — Telehealth: Payer: Self-pay

## 2023-06-11 LAB — COMPREHENSIVE METABOLIC PANEL
AG Ratio: 1.6 (calc) (ref 1.0–2.5)
ALT: 21 U/L (ref 6–29)
AST: 18 U/L (ref 10–35)
Albumin: 4.2 g/dL (ref 3.6–5.1)
Alkaline phosphatase (APISO): 62 U/L (ref 37–153)
BUN: 9 mg/dL (ref 7–25)
CO2: 23 mmol/L (ref 20–32)
Calcium: 9.3 mg/dL (ref 8.6–10.4)
Chloride: 105 mmol/L (ref 98–110)
Creat: 0.71 mg/dL (ref 0.50–1.03)
Globulin: 2.7 g/dL (ref 1.9–3.7)
Glucose, Bld: 94 mg/dL (ref 65–99)
Potassium: 4.4 mmol/L (ref 3.5–5.3)
Sodium: 137 mmol/L (ref 135–146)
Total Bilirubin: 0.3 mg/dL (ref 0.2–1.2)
Total Protein: 6.9 g/dL (ref 6.1–8.1)

## 2023-06-11 LAB — CBC WITH DIFFERENTIAL/PLATELET
Absolute Lymphocytes: 1956 {cells}/uL (ref 850–3900)
Absolute Monocytes: 462 {cells}/uL (ref 200–950)
Basophils Absolute: 62 {cells}/uL (ref 0–200)
Basophils Relative: 0.8 %
Eosinophils Absolute: 285 {cells}/uL (ref 15–500)
Eosinophils Relative: 3.7 %
HCT: 39.3 % (ref 35.0–45.0)
Hemoglobin: 12.6 g/dL (ref 11.7–15.5)
MCH: 28.9 pg (ref 27.0–33.0)
MCHC: 32.1 g/dL (ref 32.0–36.0)
MCV: 90.1 fL (ref 80.0–100.0)
MPV: 9.6 fL (ref 7.5–12.5)
Monocytes Relative: 6 %
Neutro Abs: 4936 {cells}/uL (ref 1500–7800)
Neutrophils Relative %: 64.1 %
Platelets: 356 10*3/uL (ref 140–400)
RBC: 4.36 10*6/uL (ref 3.80–5.10)
RDW: 12.6 % (ref 11.0–15.0)
Total Lymphocyte: 25.4 %
WBC: 7.7 10*3/uL (ref 3.8–10.8)

## 2023-06-11 LAB — LIPID PANEL
Cholesterol: 205 mg/dL — ABNORMAL HIGH (ref <200)
HDL: 61 mg/dL (ref >=50)
LDL Cholesterol (Calc): 120 mg/dL — ABNORMAL HIGH
Non-HDL Cholesterol (Calc): 144 mg/dL — ABNORMAL HIGH (ref <130)
Total CHOL/HDL Ratio: 3.4 (calc) (ref <5.0)
Triglycerides: 125 mg/dL (ref <150)

## 2023-06-11 LAB — B12 AND FOLATE PANEL
Folate: 9.5 ng/mL
Vitamin B-12: 648 pg/mL (ref 200–1100)

## 2023-06-11 LAB — IRON,TIBC AND FERRITIN PANEL
%SAT: 15 % — ABNORMAL LOW (ref 16–45)
Ferritin: 13 ng/mL — ABNORMAL LOW (ref 16–232)
Iron: 52 ug/dL (ref 45–160)
TIBC: 354 ug/dL (ref 250–450)

## 2023-06-11 NOTE — Telephone Encounter (Signed)
Physical form completed and faxed back to [facility] at [phone]. Form sent for scanning.

## 2023-06-12 ENCOUNTER — Other Ambulatory Visit: Payer: Self-pay | Admitting: Internal Medicine

## 2023-06-12 DIAGNOSIS — R928 Other abnormal and inconclusive findings on diagnostic imaging of breast: Secondary | ICD-10-CM

## 2023-06-26 ENCOUNTER — Ambulatory Visit
Admission: RE | Admit: 2023-06-26 | Discharge: 2023-06-26 | Disposition: A | Discharge status: Home / Self Care | Payer: 59 | Source: Ambulatory Visit | Attending: Internal Medicine | Admitting: Internal Medicine

## 2023-06-26 DIAGNOSIS — R928 Other abnormal and inconclusive findings on diagnostic imaging of breast: Secondary | ICD-10-CM

## 2024-05-23 ENCOUNTER — Other Ambulatory Visit (HOSPITAL_BASED_OUTPATIENT_CLINIC_OR_DEPARTMENT_OTHER): Payer: Self-pay | Admitting: Obstetrics and Gynecology

## 2024-05-23 DIAGNOSIS — Z1231 Encounter for screening mammogram for malignant neoplasm of breast: Secondary | ICD-10-CM

## 2024-06-08 ENCOUNTER — Encounter: Payer: 59 | Admitting: Internal Medicine

## 2024-06-16 ENCOUNTER — Encounter (HOSPITAL_BASED_OUTPATIENT_CLINIC_OR_DEPARTMENT_OTHER): Payer: Self-pay

## 2024-06-16 ENCOUNTER — Ambulatory Visit (HOSPITAL_BASED_OUTPATIENT_CLINIC_OR_DEPARTMENT_OTHER)
Admission: RE | Admit: 2024-06-16 | Discharge: 2024-06-16 | Disposition: A | Discharge status: Home / Self Care | Source: Ambulatory Visit | Attending: Obstetrics and Gynecology | Admitting: Obstetrics and Gynecology

## 2024-06-16 DIAGNOSIS — Z1231 Encounter for screening mammogram for malignant neoplasm of breast: Secondary | ICD-10-CM | POA: Insufficient documentation

## 2024-06-21 ENCOUNTER — Other Ambulatory Visit: Payer: Self-pay | Admitting: Obstetrics and Gynecology

## 2024-06-21 DIAGNOSIS — R928 Other abnormal and inconclusive findings on diagnostic imaging of breast: Secondary | ICD-10-CM

## 2024-07-07 ENCOUNTER — Encounter

## 2024-07-07 ENCOUNTER — Other Ambulatory Visit

## 2024-08-15 ENCOUNTER — Encounter: Admitting: Internal Medicine
